# Patient Record
Sex: Female | Born: 1960 | Race: White | Hispanic: No | Marital: Single | Smoking: Never smoker
Health system: Southern US, Community
[De-identification: ages and names within clinical notes are randomized; demographics above are authoritative.]

## PROBLEM LIST (undated history)

## (undated) DIAGNOSIS — J45909 Unspecified asthma, uncomplicated: Secondary | ICD-10-CM

## (undated) DIAGNOSIS — T7840XA Allergy, unspecified, initial encounter: Secondary | ICD-10-CM

## (undated) DIAGNOSIS — E119 Type 2 diabetes mellitus without complications: Secondary | ICD-10-CM

## (undated) DIAGNOSIS — K219 Gastro-esophageal reflux disease without esophagitis: Secondary | ICD-10-CM

## (undated) DIAGNOSIS — R011 Cardiac murmur, unspecified: Secondary | ICD-10-CM

## (undated) HISTORY — DX: Gastro-esophageal reflux disease without esophagitis: K21.9

## (undated) HISTORY — DX: Allergy, unspecified, initial encounter: T78.40XA

## (undated) HISTORY — DX: Unspecified asthma, uncomplicated: J45.909

## (undated) HISTORY — PX: CHOLECYSTECTOMY: SHX55

## (undated) HISTORY — DX: Cardiac murmur, unspecified: R01.1

## (undated) HISTORY — DX: Type 2 diabetes mellitus without complications: E11.9

## (undated) HISTORY — PX: OTHER SURGICAL HISTORY: SHX169

---

## 1997-10-10 HISTORY — PX: NASAL SINUS SURGERY: SHX719

## 2006-02-27 ENCOUNTER — Ambulatory Visit: Payer: Self-pay | Admitting: Gastroenterology

## 2006-03-10 HISTORY — PX: NECK SURGERY: SHX720

## 2006-03-13 ENCOUNTER — Observation Stay (HOSPITAL_COMMUNITY): Admission: RE | Admit: 2006-03-13 | Discharge: 2006-03-14 | Payer: Self-pay | Admitting: Neurosurgery

## 2006-05-05 ENCOUNTER — Encounter: Payer: Self-pay | Admitting: Neurosurgery

## 2006-05-10 ENCOUNTER — Encounter: Payer: Self-pay | Admitting: Neurosurgery

## 2006-06-10 ENCOUNTER — Encounter: Payer: Self-pay | Admitting: Neurosurgery

## 2006-07-10 ENCOUNTER — Encounter: Payer: Self-pay | Admitting: Neurosurgery

## 2007-03-29 ENCOUNTER — Ambulatory Visit: Payer: Self-pay | Admitting: Family Medicine

## 2007-07-01 ENCOUNTER — Emergency Department: Payer: Self-pay | Admitting: Internal Medicine

## 2008-08-05 ENCOUNTER — Ambulatory Visit: Payer: Self-pay | Admitting: Gastroenterology

## 2008-08-08 ENCOUNTER — Encounter (HOSPITAL_COMMUNITY): Payer: Self-pay | Admitting: Obstetrics and Gynecology

## 2008-08-08 ENCOUNTER — Ambulatory Visit (HOSPITAL_COMMUNITY): Admission: RE | Admit: 2008-08-08 | Discharge: 2008-08-08 | Payer: Self-pay | Admitting: Obstetrics and Gynecology

## 2009-08-17 ENCOUNTER — Ambulatory Visit: Payer: Self-pay | Admitting: Unknown Physician Specialty

## 2009-08-17 ENCOUNTER — Other Ambulatory Visit: Payer: Self-pay | Admitting: Unknown Physician Specialty

## 2009-08-19 ENCOUNTER — Ambulatory Visit: Payer: Self-pay | Admitting: Unknown Physician Specialty

## 2010-04-07 ENCOUNTER — Ambulatory Visit: Payer: Self-pay | Admitting: Family Medicine

## 2010-05-03 ENCOUNTER — Ambulatory Visit: Payer: Self-pay | Admitting: Rheumatology

## 2010-05-20 ENCOUNTER — Encounter: Payer: Self-pay | Admitting: Rheumatology

## 2010-06-10 ENCOUNTER — Encounter: Payer: Self-pay | Admitting: Rheumatology

## 2011-02-22 NOTE — Op Note (Signed)
NAMELAYANNA, Darlene Rollins                ACCOUNT NO.:  [id number]   MEDICAL RECORD NO.:  [id number]          PATIENT TYPE:  AMB   LOCATION:                                FACILITY:  [facility]   PHYSICIAN:  Zelphia Cairo, MD    DATE OF BIRTH:  11-28-1960   DATE OF PROCEDURE:  08/08/2008  DATE OF DISCHARGE:                               OPERATIVE REPORT   PREOPERATIVE DIAGNOSES:  1. Menorrhagia.  2. Irregular menses.   POSTOPERATIVE DIAGNOSES:  1. Menorrhagia.  2. Irregular menses.   PROCEDURE:  Hysteroscopy, D&C, and NovaSure ablation.   SURGEON:  Zelphia Cairo, MD   SPECIMEN:  Endometrial curettings to Pathology.   FINDINGS:  Normal-appearing uterine cavity.   ESTIMATED BLOOD LOSS:  Minimal.   COMPLICATIONS:  None.   CONDITION:  Stable to recovery room.   DESCRIPTION OF PROCEDURE:  The patient was taken to the operating room  where anesthesia was found to be adequate.  She was placed in the dorsal  lithotomy position using Allen stirrups.  She was prepped and draped in  sterile fashion.  Weighted speculum was placed in the posterior vagina a  Deaver was placed anteriorly.  The cervix was grasped with a single-  tooth tenaculum.  The cervix was serially dilated using Pratt dilators.  Cervical length was found to be 3 cm.  Uterine cavity was found to be 7  cm.  The hysteroscope was inserted and a survey of the uterus was  performed.  Uterine lining appeared normal.  No masses were noted.  Hysteroscope was then removed and a gentle curetting was performed.  Specimen was placed on Telfa and passed off to be sent to Pathology.  NovaSure device was then inserted into the uterine cavity and NovaSure  ablation was performed using standard manufacture guidelines.  The  patient tolerated the procedure well.  Sponge lap, needle, and  instrument counts were correct x2.      Zelphia Cairo, MD  Electronically Signed    GA/MEDQ  D:  08/08/2008  T:  08/08/2008  Job:  [id number]

## 2011-02-25 NOTE — Op Note (Signed)
NAMECASANDRA, DALLAIRE NO.:  [id number]   MEDICAL RECORD NO.:  [id number]          PATIENT TYPE:  INP   LOCATION:  2899                         FACILITY:  [facility]   PHYSICIAN:  Clydene Fake, M.D.  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   POSTOPERATIVE DIAGNOSIS:  Herniated nucleus pulposus, spondylosis with  stenosis and cord compression myelopathy, C4-5 and C5-6.   PROCEDURE:  Anterior cervical diskectomy and fusion at C4-5 and C5-6 with  PEEK interbody cages; autograft bone, same incision; and EAGLE anterior  cervical plate; and allograft substitute 2 mL of bone protein.   SURGEON:  Clydene Fake, M.D.   ASSISTANT:  Cristi Loron, M.D.   ANESTHESIA:  General endotracheal tube anesthesia.   ESTIMATED BLOOD LOSS:  No blood given.   DRAINS:  None.   COMPLICATIONS:  None.   REASON FOR PROCEDURE:  The patient is a 50 year old woman who is having a  neck ache, left arm pain and numbness, turning a little slow directly from  the movements, some mild __________  problems and found on MRI to have multi-  level spondylitic changes, mostly that at C5-6, with a large central disk  protrusion and __________  ligament causing cord compression with similar  change, but not as bad, at C4-5 and more mild changes at C6-7 to C7-T1.  The  patient is brought in for decompression and fusion at C4-5 and C5-6 levels.   PROCEDURE:  The patient was brought into the operating room and general  anesthesia was induced.  The patient was placed in 10-pounds of __________  traction, prepped and draped in sterile fashion.  The surgical incision was  injected with 10 mL of 1% lidocaine with epinephrine.  An incision was then  made from the midline to the anterior border of  the sternocleidomastoid  muscle on the left side of the neck incision and to the platysma.  Hemostasis was obtained with Bovie cauterization. The platysma was incised  with the Bovie and blunt dissection was taken through the anterior cervical  fascia to the anterior cervical spine and exposed the cervical spine.  Placed a needle on the interspace.  X-rays were obtained showing we were at  the C4-5 interspace.  The interspace was then incised with a 15-blade and  prior to the diskectomy, it was removed as was the needle was removed.  The  longus collis was reflected laterally using the Bovie at C4 through C6  bilaterally and self-retaining retractors were placed.  Osteophytes were  removed from C5-6.  This disk space was incised.  Her diskectomy was  performed.  Distraction pins were placed at the C4 and C6 in the interspaces  were distracted.  The microscope was then brought in for microdissection.  At this point, a drill was used to continue the diskectomy and remove  cartilaginous endplate at both levels starting at  C5-6.  I continued with  the diskectomy with pituitary rongeurs.  With 1- and 2-mm Kerrison punches,  __________  disk starting out laterally and got to the dura.  Did a  foraminotomy on both sides and started working medially and removed disk  material, but then ran into a large calcified ligament that was somewhat  adherent to the dura.  We carefully dissected the ligament off of the dura  using hooks and removing it with Kerrison punches.  We were able to get this  removed.  There was a small dural rent that occurred or at least a partial  __________  material was still there.  No leakage of CSF even with Valsalva  was seen again.  No leakage of CSF ever occurred.  Again, we removed  osteophytes and calcified ligament, both superior and inferior endplates and  got good __________  of the central canal.  We again checked the nerve  roots, they are decompressed.  Teflon  __________ was placed in the  interspaces.   Our attention was then taken to what should be C4-5.  We begin diskectomy  with pituitary rongeurs and curets of 1-mm and 2-mm Kerrison punches are  used to remove posterior ligament, posterior disk, and perform bilateral  foraminotomies.  __________  we had good decompression of the central canal  and foramen.  Measured the interspaces to be 5 mm at C4-5 and 5 mm at 5-6.  The PEEK interbody cages were then packed with autograft bone.  This was  from the osteophytes removed during the case.  They were chopped up into  small pieces, packed in the cage, and some demineralized bone protein was  then placed in and around the autograft bone.  The cages were then tapped  into place, countersunk about 1 mm.  We checked post-radiograph and there is  plenty of room between bone graft and dura.  The distraction pins were  removed.  Hemostasis was obtained with Gelfoam and thrombin __________  antibiotic solution with good position of the cages. They are permanently  placed.  Traction was removed.  They remained in place.  An EAGLE anterior  cervical plate was placed over the anterior cervical spine.  Two screws were  placed into C4, two into C5, two into C6.  These were tightened down and  lateral x-rays were obtained.  It showed good position of plate and screws  at C4-5, but we could not see C5-6 level because of the patient's shoulders  and body habitus.  Traction was removed.  Hemostasis was obtained with Bovie  cauterization and bipolar cauterization with __________  solution, we had  good hemostasis.  The platysma was closed with 3-0 Vicryl interrupted  suture.  The subcutaneous tissue was closed with the same.  The skin was  closed with benzoin and Steri-Strips.  A dressing was applied.  The patient  was placed back into a soft cervical collar, awoke from anesthesia and  transferred to the recovery room in stable condition.           ______________________________  Clydene Fake, M.D.     JRH/MEDQ  D:  03/13/2006  T:  03/14/2006  Job:  [id number]

## 2011-07-08 ENCOUNTER — Ambulatory Visit: Payer: Self-pay | Admitting: Gastroenterology

## 2011-07-12 LAB — ABO/RH: ABO/RH(D): O POS

## 2011-07-12 LAB — PREGNANCY, URINE: Preg Test, Ur: NEGATIVE

## 2011-07-12 LAB — BASIC METABOLIC PANEL
BUN: 9
CO2: 30
Creatinine, Ser: 0.85
GFR calc non Af Amer: >60
Potassium: 4.1
Sodium: 139

## 2011-07-12 LAB — CBC
Hemoglobin: 12.5
MCHC: 32.8
MCV: 85
RBC: 4.49
RDW: 14.5

## 2011-07-12 LAB — GLUCOSE, CAPILLARY: Glucose-Capillary: 152 — ABNORMAL HIGH

## 2011-07-12 LAB — TYPE AND SCREEN: ABO/RH(D): O POS

## 2012-04-04 ENCOUNTER — Ambulatory Visit: Payer: Self-pay | Admitting: Dermatology

## 2012-04-09 ENCOUNTER — Ambulatory Visit: Payer: Self-pay | Admitting: Family Medicine

## 2012-08-08 ENCOUNTER — Ambulatory Visit: Payer: Self-pay | Admitting: Gastroenterology

## 2012-08-21 ENCOUNTER — Ambulatory Visit: Payer: Self-pay | Admitting: Gastroenterology

## 2012-08-30 ENCOUNTER — Ambulatory Visit: Payer: Self-pay | Admitting: Gastroenterology

## 2012-10-17 ENCOUNTER — Ambulatory Visit: Payer: Self-pay | Admitting: Surgery

## 2012-11-01 ENCOUNTER — Ambulatory Visit: Payer: Self-pay | Admitting: Surgery

## 2012-11-02 LAB — PATHOLOGY REPORT

## 2013-04-22 ENCOUNTER — Ambulatory Visit: Payer: Self-pay | Admitting: Family Medicine

## 2013-09-13 ENCOUNTER — Ambulatory Visit: Payer: Self-pay | Admitting: Gastroenterology

## 2013-09-18 LAB — PATHOLOGY REPORT

## 2013-10-30 ENCOUNTER — Ambulatory Visit: Payer: Self-pay | Admitting: Gastroenterology

## 2014-05-14 ENCOUNTER — Ambulatory Visit: Payer: Self-pay | Admitting: Family Medicine

## 2014-07-18 ENCOUNTER — Ambulatory Visit: Payer: Self-pay | Admitting: Gastroenterology

## 2014-07-21 LAB — PATHOLOGY REPORT

## 2014-10-10 HISTORY — PX: NECK SURGERY: SHX720

## 2014-10-27 ENCOUNTER — Ambulatory Visit: Payer: Self-pay | Admitting: Surgery

## 2015-01-30 NOTE — Op Note (Signed)
PATIENT NAME:  Darlene Rollins, Darlene Rollins MR#:  [id number] DATE OF BIRTH:  [birth year]  DATE OF PROCEDURE:  11/01/2012  PREOPERATIVE DIAGNOSIS: Chronic acalculous cholecystitis.   POSTOPERATIVE DIAGNOSIS: Chronic acalculous cholecystitis.  PROCEDURE: Laparoscopic cholecystectomy.  SURGEON: Renda RollsWilton Cherish Runde, MD  ANESTHESIA: General.   INDICATION: This 54 year old female has a five year history of epigastric pains. She had a hepatobiliary scan with injection of cholecystokinin. The ejection fraction was 55%. However, the injection exactly reproduced her pain and surgery was recommended for definitive treatment.   DESCRIPTION OF PROCEDURE: The patient was placed on the operating table, in the supine position, under general endotracheal anesthesia. The abdomen was prepared with ChloraPrep and draped in a sterile manner.   A short incision was made in the inferior aspect of the umbilicus and carried down to the deep fascia which was grasped with laryngeal hook and elevated. A Veress needle was inserted, aspirated and irrigated with a saline solution. Next, the peritoneal cavity was inflated with carbon dioxide. The Veress needle was removed. The 10 mm cannula was inserted. The 10 mm, 0 degree laparoscope was then introduced to view the peritoneal cavity. The liver appeared to have a smooth surface and typical color. Brief survey revealed no specific abnormality with the intestines that came into view. Another incision was made in the epigastrium slightly to the right of the midline to introduce an 11 mm cannula. Two 5 mm cannulas were inserted through short incisions, in the right upper quadrant. The gallbladder was found to have a fatty surface and was grasped and retracted towards the right shoulder. The infundibulum was retracted inferiorly and laterally. Dissection was carried down through a layer of fat with blunt and sharp dissection and isolated the cystic duct from surrounding structures. The cystic artery was  dissected free from surrounding structures. The gallbladder neck was mobilized with incision of the visceral peritoneum. A critical view of safety was demonstrated. An Endo Clip was placed across the cystic duct adjacent to the neck of the gallbladder. An incision was made in the cystic duct to introduce a Reddick catheter. The Reddick catheter would only thread in as far as the balloon, however, it would not irrigate and the balloon would not stay in intact and, therefore, cholangiogram was not done. The Reddick catheter was removed. The cystic duct was doubly ligated with endoclips and divided. The cystic artery was controlled with double endoclips and divided. The gallbladder was dissected free from the liver with hook and cautery. A small amount of blood was aspirated. Several small bleeding points were cauterized. Hemostasis was subsequently intact. The gallbladder was completely separated and was brought up through the infraumbilical incision, opened and suctioned and removed and submitted in formalin for routine pathology. The right upper quadrant was further inspected. The cannulas were removed. Carbon dioxide was allowed to escape from the peritoneal cavity. Skin incisions were closed with interrupted 5-0 chromic subcuticular sutures, benzoin and Steri-Strips. Dressings were applied with paper tape. The patient tolerated surgery satisfactorily and was then moved to the recovery room for postoperative care. ____________________________ J. Renda RollsWilton Syncere Kaminski, MD jws:sb Rollins: 11/01/2012 13:07:56 ET T: 11/01/2012 13:31:47 ET JOB#: [id number]  cc: Adella HareJ. Wilton Hensley Treat, MD, <Dictator> Adella HareWILTON J Ianna Salmela MD ELECTRONICALLY SIGNED 11/03/2012 12:40

## 2015-07-30 ENCOUNTER — Other Ambulatory Visit: Payer: Self-pay | Admitting: Family Medicine

## 2015-07-30 DIAGNOSIS — Z1231 Encounter for screening mammogram for malignant neoplasm of breast: Secondary | ICD-10-CM

## 2015-08-13 ENCOUNTER — Ambulatory Visit: Payer: Self-pay | Attending: Family Medicine

## 2015-09-01 ENCOUNTER — Ambulatory Visit
Admission: RE | Admit: 2015-09-01 | Discharge: 2015-09-01 | Disposition: A | Discharge status: Home / Self Care | Payer: BLUE CROSS/BLUE SHIELD | Source: Ambulatory Visit | Attending: Family Medicine | Admitting: Family Medicine

## 2015-09-01 DIAGNOSIS — Z1231 Encounter for screening mammogram for malignant neoplasm of breast: Secondary | ICD-10-CM | POA: Insufficient documentation

## 2016-12-02 ENCOUNTER — Other Ambulatory Visit: Payer: Self-pay | Admitting: Family Medicine

## 2016-12-02 DIAGNOSIS — Z1231 Encounter for screening mammogram for malignant neoplasm of breast: Secondary | ICD-10-CM

## 2016-12-30 ENCOUNTER — Ambulatory Visit: Payer: BLUE CROSS/BLUE SHIELD

## 2017-01-03 ENCOUNTER — Ambulatory Visit
Admission: RE | Admit: 2017-01-03 | Discharge: 2017-01-03 | Disposition: A | Discharge status: Home / Self Care | Payer: BLUE CROSS/BLUE SHIELD | Source: Ambulatory Visit | Attending: Family Medicine | Admitting: Family Medicine

## 2017-01-03 DIAGNOSIS — Z1231 Encounter for screening mammogram for malignant neoplasm of breast: Secondary | ICD-10-CM | POA: Diagnosis not present

## 2017-01-31 ENCOUNTER — Encounter: Payer: Self-pay | Admitting: Speech Pathology

## 2017-01-31 ENCOUNTER — Ambulatory Visit: Payer: BLUE CROSS/BLUE SHIELD | Attending: Unknown Physician Specialty | Admitting: Speech Pathology

## 2017-01-31 DIAGNOSIS — R490 Dysphonia: Secondary | ICD-10-CM | POA: Insufficient documentation

## 2017-01-31 NOTE — Therapy (Signed)
[place] [facility] MAIN [facility] SERVICES [address] [place], [place], [address] Phone: [phone]   Fax:  [phone]  Speech Language Pathology Evaluation  Patient Details  Name: Darlene Rollins MRN: [id number] Date of Birth: [birth year] Referring Provider: Dr. Jenne Campus  Encounter Date: 01/31/2017      End of Session - 01/31/17 1445    Visit Number 1   Number of Visits 17   Date for SLP Re-Evaluation 04/02/17   SLP Start Time 0906   SLP Stop Time  0956   SLP Time Calculation (min) 50 min   Activity Tolerance Patient tolerated treatment well      Past Medical History:  Diagnosis Date  . Allergy   . Asthma   . Diabetes mellitus without complication (HCC)   . GERD (gastroesophageal reflux disease)     Past Surgical History:  Procedure Laterality Date  . cervical surgeries      There were no vitals filed for this visit.          SLP Evaluation [facility] - 01/31/17 0001      SLP Visit Information   SLP Received On 01/31/17   Referring Provider Dr. Jenne Campus   Onset Date 01/24/2017   Medical Diagnosis Dysphonia     Subjective   Subjective Complaints 2 years ago: Patient not really concerned about her voice, but her surgeon noted some hoarseness and wanted this checked prior to revision ACDF. Patient reports some hoarseness going back 10+ years. This has worsened somewhat, such that she stopped singing in church choir 2 years ago. She has allergies, asthma, GER. She has to sleep upright to avoid reflux. Has some postnasal drainage and coughing, usually dry. She says her voice has always been deep   Patient/Family Stated Goal Talk with a real voice     General Information   HPI 56 year old woman referred by Dr. Jenne Campus for voice therapy.  Per report, laryngeal findings include: erythema resolved, small ulcer on false cord gone, and still using false cords to talk. The patient reports that she was evaluated at [facility] by ENT and SLP/voice therapist  in 2016.  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      Prior Functional Status   Cognitive/Linguistic Baseline Within functional limits     Oral Motor/Sensory Function   Overall Oral Motor/Sensory Function Appears within functional limits for tasks assessed     Motor Speech   Overall Motor Speech Impaired   Respiration Impaired   Level of Impairment Conversation   Phonation Aphonic   Resonance --  Pharyngeal focus when voicing   Articulation Within functional limitis   Intelligibility Intelligible   Phonation Impaired   Vocal Abuses Habitual Hyperphonia   Tension Present Jaw;Neck;Shoulder   Volume Soft  aphonic   Pitch Aphonic     Standardized Assessments   Standardized Assessments  Other Assessment  Perceptual Voice Evaluation      Perceptual Voice Evaluation Voice checklist: . Health risks: asthma, GERD, allergies, multiple neck surgeries . Characteristic voice use: laryngitis X4 months  . Environmental risks: no significant environmental risks . Misuse/Abuse: coughing/throat clearing; excessive muscle tension; whispering Vocal characteristics: aphonic; (2 years ago: Poor breath-phonation coordination; hoarse vocal quality; and pharyngeal tone-focus) Maximum phonation time for sustained "ah": Cannot phonate Average fundamental frequency during sustained "ah": N/A Highest dynamic pitch when altering pitch from a low note to a high  note: N/A Lowest dynamic pitch when altering from a high note to a low note: N/A Highest dynamic pitch in conversational speech: N/A Lowest dynamic pitch in conversational speech: N/A Average time patient was able to sustain /s/: 13 seconds Average time patient was able to sustain /z/: N/A s/z ratio : N/A Education: Patient instructed in extrinsic laryngeal muscle stretches; breath support exercises; lip/tongue trill; straw  phonation Stimulability: Patient achieved phonation (hoarse and strained) with trills and straw phonation      SLP Education - 01/31/17 1445    Education provided Yes   Education Details Role of SLP in voice therapy   Person(s) Educated Patient   Methods Explanation   Comprehension Verbalized understanding            SLP Long Term Goals - 01/31/17 1447      SLP LONG TERM GOAL #1   Title The patient will demonstrate independent understanding of vocal hygiene concepts and extrinsic laryngeal muscle stretches.     Time 8   Period Weeks   Status New     SLP LONG TERM GOAL #2   Title The patient will be independent for abdominal breathing and breath support exercises.   Time 8   Period Weeks   Status New     SLP LONG TERM GOAL #3   Title The patient will maximize voice quality and loudness using breath support/oral resonance for sustained vowel production, pitch glides, and hierarchal speech drill.   Time 8   Period Weeks   Status New     SLP LONG TERM GOAL #4   Title The patient will maximize voice quality and loudness using breath support/oral resonance for paragraph length recitation with 80% accuracy.   Time 8   Period Weeks   Status New          Plan - 01/31/17 1446    Clinical Impression Statement 56 year old woman; under the care of Dr. 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 The patient reports long term history of voice disturbance, but she has never experienced ongoing laryngitis.  The patient was stimulable to phonation (hoarse and strained) with trills and straw phonation.  She will benefit from voice therapy for education, to improve breath support/control, reduce laryngeal tension, and improve tone focus.   Speech Therapy Frequency 2x / week   Duration Other (comment)  8 weeks   Potential to Achieve  Goals Good   Potential Considerations Ability to learn/carryover information;Co-morbidities;Cooperation/participation level;Medical prognosis;Previous level of function;Severity of impairments;Family/community support   SLP Home Exercise Plan extrinsic laryngeal muscle stretches; breath support exercises; lip/tongue trill; straw phonation   Consulted and Agree with Plan of Care Patient      Patient will benefit from skilled therapeutic intervention in order to improve the following deficits and impairments:   Dysphonia - Plan: SLP plan of care cert/re-cert    Problem List There are no active problems to display for this patient.  [name], MS/CCC- SLP  Leandrew Koyanagi 01/31/2017, 2:51 PM  [place] [facility] MAIN [facility] SERVICES [address] [place], [place], [address] Phone: [phone]   Fax:  [phone]  Name: DESTINY HAGIN MRN: [id number] Date of Birth: 1961-08-05

## 2017-02-07 ENCOUNTER — Ambulatory Visit: Payer: BLUE CROSS/BLUE SHIELD | Attending: Unknown Physician Specialty | Admitting: Speech Pathology

## 2017-02-07 DIAGNOSIS — R490 Dysphonia: Secondary | ICD-10-CM | POA: Diagnosis present

## 2017-02-07 NOTE — Therapy (Signed)
[place] [facility] MAIN [facility] SERVICES [address] [place], [place], [address] Phone: [phone]   Fax:  [phone]  Speech Language Pathology Treatment  Patient Details  Name: Darlene Rollins MRN: [id number] Date of Birth: Oct 31, 1960 Referring Provider: Dr. Jenne Campus  Encounter Date: 02/07/2017      End of Session - 02/07/17 1115    Visit Number 2   Number of Visits 17   Date for SLP Re-Evaluation 04/02/17   SLP Start Time 0900   SLP Stop Time  0950   SLP Time Calculation (min) 50 min   Activity Tolerance Patient tolerated treatment well      Past Medical History:  Diagnosis Date  . Allergy   . Asthma   . Diabetes mellitus without complication (HCC)   . GERD (gastroesophageal reflux disease)     Past Surgical History:  Procedure Laterality Date  . cervical surgeries      There were no vitals filed for this visit.             ADULT SLP TREATMENT - 02/07/17 0001      General Information   Behavior/Cognition Alert;Cooperative;Pleasant mood   HPI 56 year old woman referred by Dr. Jenne Campus for voice therapy.  Per report, laryngeal findings include: erythema resolved, small ulcer on false cord gone, and still using false cords to talk. The patient reports that she was evaluated at [facility] by ENT and SLP/voice therapist in 2016.  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      Treatment Provided   Treatment provided Cognitive-Linquistic     Pain Assessment   Pain Assessment No/denies pain     Cognitive-Linquistic Treatment   Treatment focused on Voice   Skilled Treatment The patient was provided with written and verbal teaching regarding neck, shoulder, tongue, and throat stretches exercises to promote relaxed phonation. The patient was provided with written and verbal teaching for supplement vocal tract relaxation exercises (straw  phonation).  The patient was provided with written and verbal teaching regarding breath support exercises.  Patient instructed in relaxed phonation / oral resonance. Improved oral resonance/relaxed phonation for hum/hum + vowel/ initial /m/ words.       Assessment / Recommendations / Plan   Plan Continue with current plan of care     Progression Toward Goals   Progression toward goals Progressing toward goals          SLP Education - 02/07/17 1114    Education provided Yes   Education Details Breath support, relaxed phonation, tone focus   Person(s) Educated Patient   Methods Explanation   Comprehension Verbalized understanding            SLP Long Term Goals - 01/31/17 1447      SLP LONG TERM GOAL #1   Title The patient will demonstrate independent understanding of vocal hygiene concepts and extrinsic laryngeal muscle stretches.     Time 8   Period Weeks   Status New     SLP LONG TERM GOAL #2   Title The patient will be independent for abdominal breathing and breath support exercises.   Time 8   Period Weeks   Status New     SLP LONG TERM GOAL #3   Title The patient will maximize voice quality and loudness using breath support/oral resonance for sustained vowel production, pitch glides, and hierarchal speech drill.  Time 8   Period Weeks   Status New     SLP LONG TERM GOAL #4   Title The patient will maximize voice quality and loudness using breath support/oral resonance for paragraph length recitation with 80% accuracy.   Time 8   Period Weeks   Status New          Plan - 02/07/17 1115    Clinical Impression Statement The patient is generating voice, albeit moderately hoarse and strained.  She is able to improve quality and tone focus with nasality and improved strength of breath support.   Speech Therapy Frequency 2x / week   Duration Other (comment)   Treatment/Interventions Patient/family education;Other (comment)  Voice therapy   Potential to Achieve  Goals Good   Potential Considerations Ability to learn/carryover information;Co-morbidities;Cooperation/participation level;Medical prognosis;Previous level of function;Severity of impairments;Family/community support   SLP Home Exercise Plan extrinsic laryngeal muscle stretches; breath support exercises; lip/tongue trill; straw phonation; oral resonance   Consulted and Agree with Plan of Care Patient      Patient will benefit from skilled therapeutic intervention in order to improve the following deficits and impairments:   Dysphonia    Problem List There are no active problems to display for this patient.  [name], MS/CCC- SLP  Leandrew Koyanagi 02/07/2017, 11:16 AM  [place] [facility] MAIN [facility] SERVICES [address] [place], [place], [address] Phone: [phone]   Fax:  [phone]   Name: Darlene Rollins MRN: [id number] Date of Birth: 20-May-1961

## 2017-02-10 ENCOUNTER — Encounter: Payer: Self-pay | Admitting: Speech Pathology

## 2017-02-10 ENCOUNTER — Ambulatory Visit: Payer: BLUE CROSS/BLUE SHIELD | Admitting: Speech Pathology

## 2017-02-10 DIAGNOSIS — R490 Dysphonia: Secondary | ICD-10-CM

## 2017-02-10 NOTE — Therapy (Signed)
[facility] [address] [place], [place], [address] Phone: [phone]   Fax:  [phone]  Speech Language Pathology Treatment  Patient Details  Name: Darlene Rollins MRN: [id number] Date of Birth: 07/02/1961 Referring Provider: Dr. Jenne CampusMcQueen  Encounter Date: 02/10/2017      End of Session - 02/10/17 0958    Visit Number 3   Number of Visits 17   Date for SLP Re-Evaluation 04/02/17   SLP Start Time 0900   SLP Stop Time  0948   SLP Time Calculation (min) 48 min   Activity Tolerance Patient tolerated treatment well      Past Medical History:  Diagnosis Date  . Allergy   . Asthma   . Diabetes mellitus without complication (HCC)   . GERD (gastroesophageal reflux disease)     Past Surgical History:  Procedure Laterality Date  . cervical surgeries      There were no vitals filed for this visit.      Subjective Assessment - 02/10/17 0957    Subjective Patient feels that she is not using her false cords   Currently in Pain? No/denies               ADULT SLP TREATMENT - 02/10/17 0001      General Information   Behavior/Cognition Alert;Cooperative;Pleasant mood   HPI 56 year old woman referred by Dr. Jenne CampusMcQueen for voice therapy.  Per report, laryngeal findings include: erythema resolved, small ulcer on false cord gone, and still using false cords to talk. The patient [facility]voice therapist in 2016.  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      Treatment Provided   Treatment provided Cognitive-Linquistic     Pain Assessment   Pain Assessment No/denies pain     Cognitive-Linquistic Treatment   Treatment focused on Voice   Skilled Treatment The patient was provided with written and verbal teaching regarding neck, shoulder, tongue, and throat stretches exercises  to promote relaxed phonation. The patient was provided with written and verbal teaching for supplement vocal tract relaxation exercises (straw phonation).  The patient was provided with written and verbal teaching regarding breath support exercises.  Patient instructed in relaxed phonation / oral resonance. Improved oral resonance/relaxed phonation for "whooo".  Patient not able to generate oral resonance for other vowels within this context.  She understands the target and will practice.     Assessment / Recommendations / Plan   Plan Continue with current plan of care     Progression Toward Goals   Progression toward goals Progressing toward goals          SLP Education - 02/10/17 0958    Education provided Yes   Education Details Semi-occluded vocal tract   Person(s) Educated Patient   Methods Explanation   Comprehension Verbalized understanding            SLP Long Term Goals - 01/31/17 1447      SLP LONG TERM GOAL #1   Title The patient will demonstrate independent understanding of vocal hygiene concepts and extrinsic laryngeal muscle stretches.     Time 8   Period Weeks   Status New     SLP LONG TERM GOAL #2   Title The patient will be independent for abdominal breathing and breath support exercises.   Time 8  Period Weeks   Status New     SLP LONG TERM GOAL #3   Title The patient will maximize voice quality and loudness using breath support/oral resonance for sustained vowel production, pitch glides, and hierarchal speech drill.   Time 8   Period Weeks   Status New     SLP LONG TERM GOAL #4   Title The patient will maximize voice quality and loudness using breath support/oral resonance for paragraph length recitation with 80% accuracy.   Time 8   Period Weeks   Status New          Plan - 02/10/17 4098    Clinical Impression Statement The patient is generating voice, albeit moderately hoarse and strained.  She is able to improve quality and tone focus with  semi-occluded vocal tract and improved strength of breath support.   Speech Therapy Frequency 2x / week   Duration Other (comment)   Treatment/Interventions Patient/family education;Other (comment)  Voice therapy   Potential to Achieve Goals Good   Potential Considerations Ability to learn/carryover information;Co-morbidities;Cooperation/participation level;Medical prognosis;Previous level of function;Severity of impairments;Family/community support   SLP Home Exercise Plan extrinsic laryngeal muscle stretches; breath support exercises; lip/tongue trill; straw phonation; oral resonance   Consulted and Agree with Plan of Care Patient      Patient will benefit from skilled therapeutic intervention in order to improve the following deficits and impairments:   Dysphonia    Problem List There are no active problems to display for this patient.  [name], MS/CCC- SLP  Leandrew Koyanagi 02/10/2017, 9:59 AM  [place] [facility] MAIN [facility] SERVICES [address] [place], [place], [address] Phone: [phone]   Fax:  [phone]   Name: Darlene Rollins MRN: [id number] Date of Birth: 04-Jul-1961

## 2017-02-14 ENCOUNTER — Ambulatory Visit: Payer: BLUE CROSS/BLUE SHIELD | Admitting: Speech Pathology

## 2017-02-14 DIAGNOSIS — R490 Dysphonia: Secondary | ICD-10-CM

## 2017-02-15 ENCOUNTER — Encounter: Payer: Self-pay | Admitting: Speech Pathology

## 2017-02-15 NOTE — Therapy (Signed)
[facility] [address] [place], [place], [address] Phone: [phone]   Fax:  [phone]  Speech Language Pathology Treatment  Patient Details  Name: Darlene Rollins MRN: [id number] Date of Birth: 09/16/1961 Referring Provider: Dr. Jenne CampusMcQueen  Encounter Date: 02/14/2017      End of Session - 02/15/17 1027    Visit Number 4   Number of Visits 17   Date for SLP Re-Evaluation 04/02/17   SLP Start Time 0900   SLP Stop Time  0947   SLP Time Calculation (min) 47 min   Activity Tolerance Patient tolerated treatment well      Past Medical History:  Diagnosis Date  . Allergy   . Asthma   . Diabetes mellitus without complication (HCC)   . GERD (gastroesophageal reflux disease)     Past Surgical History:  Procedure Laterality Date  . cervical surgeries      There were no vitals filed for this visit.      Subjective Assessment - 02/15/17 1026    Subjective Patient feels that she is not using her false cords               ADULT SLP TREATMENT - 02/15/17 0001      General Information   Behavior/Cognition Alert;Cooperative;Pleasant mood   HPI 56 year old woman referred by Dr. Jenne CampusMcQueen for voice therapy.  Per report, laryngeal findings include: erythema resolved, small ulcer on false cord gone, and still using false cords to talk. The patient reports that [facility]voice therapist in 2016.  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      Treatment Provided   Treatment provided Cognitive-Linquistic     Pain Assessment   Pain Assessment No/denies pain     Cognitive-Linquistic Treatment   Treatment focused on Voice   Skilled Treatment The patient was provided with written and verbal teaching regarding neck, shoulder, tongue, and throat stretches exercises to promote relaxed phonation.  The patient was provided with written and verbal teaching for supplement vocal tract relaxation exercises (straw phonation).  The patient was provided with written and verbal teaching regarding breath support exercises.  Patient instructed in relaxed phonation / oral resonance. Improved oral resonance/relaxed phonation for "whooo" with inconsistent oral resonance for other vowels within this context.  She understands the target and will practice.     Assessment / Recommendations / Plan   Plan Continue with current plan of care     Progression Toward Goals   Progression toward goals Progressing toward goals          SLP Education - 02/15/17 1026    Education provided Yes   Education Details Semi-occluded vocal tract, flow phonation   Person(s) Educated Patient   Methods Explanation   Comprehension Verbalized understanding            SLP Long Term Goals - 01/31/17 1447      SLP LONG TERM GOAL #1   Title The patient will demonstrate independent understanding of vocal hygiene concepts and extrinsic laryngeal muscle stretches.     Time 8   Period Weeks   Status New     SLP LONG TERM GOAL #2   Title The patient will be independent for abdominal breathing and breath support exercises.   Time 8   Period Weeks   Status New  SLP LONG TERM GOAL #3   Title The patient will maximize voice quality and loudness using breath support/oral resonance for sustained vowel production, pitch glides, and hierarchal speech drill.   Time 8   Period Weeks   Status New     SLP LONG TERM GOAL #4   Title The patient will maximize voice quality and loudness using breath support/oral resonance for paragraph length recitation with 80% accuracy.   Time 8   Period Weeks   Status New          Plan - 02/15/17 1027    Clinical Impression Statement The patient is generating voice, albeit moderately hoarse and strained.  She is able to improve quality and tone focus with semi-occluded vocal tract  and improved strength of breath support.  She is consistently generating good vocal quality with "whoo" with inconsistent generalization to other vowels.   Speech Therapy Frequency 2x / week   Duration Other (comment)   Treatment/Interventions Patient/family education;Other (comment)  Voice therapy   Potential to Achieve Goals Good   Potential Considerations Ability to learn/carryover information;Co-morbidities;Cooperation/participation level;Medical prognosis;Previous level of function;Severity of impairments;Family/community support   SLP Home Exercise Plan extrinsic laryngeal muscle stretches; breath support exercises; lip/tongue trill; straw phonation; oral resonance   Consulted and Agree with Plan of Care Patient      Patient will benefit from skilled therapeutic intervention in order to improve the following deficits and impairments:   Dysphonia    Problem List There are no active problems to display for this patient.  [name], MS/CCC- SLP  Leandrew Koyanagi 02/15/2017, 10:33 AM  [place] [facility] MAIN [facility] SERVICES [address] [place], [place], [address] Phone: [phone]   Fax:  [phone]   Name: Darlene Rollins MRN: [id number] Date of Birth: Mar 26, 1961

## 2017-02-17 ENCOUNTER — Encounter: Payer: Self-pay | Admitting: Speech Pathology

## 2017-02-17 ENCOUNTER — Ambulatory Visit: Payer: BLUE CROSS/BLUE SHIELD | Admitting: Speech Pathology

## 2017-02-17 DIAGNOSIS — R490 Dysphonia: Secondary | ICD-10-CM

## 2017-02-17 NOTE — Therapy (Signed)
[facility] MAIN [facility] SERVICES [address] [place], [place], [address] Phone: [phone]   Fax:  [phone]  Speech Language Pathology Treatment  Patient Details  Name: Darlene Rollins MRN: [id number] Date of Birth: 1961-06-21 Referring Provider: Dr. Jenne Campus  Encounter Date: 02/17/2017      End of Session - 02/17/17 0956    Visit Number 5   Number of Visits 17   Date for SLP Re-Evaluation 04/02/17   SLP Start Time 0858   SLP Stop Time  0950   SLP Time Calculation (min) 52 min   Activity Tolerance Patient tolerated treatment well      Past Medical History:  Diagnosis Date  . Allergy   . Asthma   . Diabetes mellitus without complication (HCC)   . GERD (gastroesophageal reflux disease)     Past Surgical History:  Procedure Laterality Date  . cervical surgeries      There were no vitals filed for this visit.      Subjective Assessment - 02/17/17 0955    Subjective Patient states she was able to "clear out some of the crud" and has better voice as a consequence    Currently in Pain? No/denies               ADULT SLP TREATMENT - 02/17/17 0001      General Information   Behavior/Cognition Alert;Cooperative;Pleasant mood   HPI 56 year old woman referred by Dr. Jenne Campus for voice therapy.  Per report, laryngeal findings include: erythema resolved, small ulcer on false cord gone, and still using false cords to talk. The patient reports that she was evaluated at [facility] by ENT and SLP/voice therapist in 2016.  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      Treatment Provided   Treatment provided Cognitive-Linquistic     Pain Assessment   Pain Assessment No/denies pain     Cognitive-Linquistic Treatment   Treatment focused on Voice   Skilled Treatment The patient was provided with written and verbal teaching regarding neck,  shoulder, tongue, and throat stretches exercises to promote relaxed phonation. The patient was provided with written and verbal teaching for supplement vocal tract relaxation exercises (straw phonation).  The patient was provided with written and verbal teaching regarding breath support exercises.  Patient instructed in relaxed phonation / oral resonance. Improved oral resonance/relaxed phonation for "whooo" with inconsistent oral resonance for other vowels within this context.  Patient was able to identify that she "opens the throat wide" to improve vocal quality.     Assessment / Recommendations / Plan   Plan Continue with current plan of care     Progression Toward Goals   Progression toward goals Progressing toward goals          SLP Education - 02/17/17 0956    Education provided Yes   Education Details Oral resonance, breaking pharyngeal resonance   Person(s) Educated Patient   Methods Explanation   Comprehension Verbalized understanding            SLP Long Term Goals - 01/31/17 1447      SLP LONG TERM GOAL #1   Title The patient will demonstrate independent understanding of vocal hygiene concepts and extrinsic laryngeal muscle stretches.     Time 8   Period Weeks   Status New     SLP LONG TERM GOAL #2   Title  The patient will be independent for abdominal breathing and breath support exercises.   Time 8   Period Weeks   Status New     SLP LONG TERM GOAL #3   Title The patient will maximize voice quality and loudness using breath support/oral resonance for sustained vowel production, pitch glides, and hierarchal speech drill.   Time 8   Period Weeks   Status New     SLP LONG TERM GOAL #4   Title The patient will maximize voice quality and loudness using breath support/oral resonance for paragraph length recitation with 80% accuracy.   Time 8   Period Weeks   Status New          Plan - 02/17/17 0957    Clinical Impression Statement The patient is generating  voice, albeit moderately hoarse and strained.  She is able to improve quality and tone focus with semi-occluded vocal tract and improved strength of breath support.  She is consistently generating good vocal quality with "whoo" with inconsistent generalization to other vowels. Today she was able to identify how she improves the vocal quality.   Speech Therapy Frequency 2x / week   Duration Other (comment)   Treatment/Interventions Patient/family education;Other (comment)  Voice therapy   Potential to Achieve Goals Good   Potential Considerations Ability to learn/carryover information;Co-morbidities;Cooperation/participation level;Medical prognosis;Previous level of function;Severity of impairments;Family/community support   SLP Home Exercise Plan extrinsic laryngeal muscle stretches; breath support exercises; lip/tongue trill; straw phonation; oral resonance   Consulted and Agree with Plan of Care Patient      Patient will benefit from skilled therapeutic intervention in order to improve the following deficits and impairments:   Dysphonia    Problem List There are no active problems to display for this patient.  Dollene PrimroseSusan G Francetta Ilg, MS/CCC- SLP  Leandrew KoyanagiAbernathy, Susie 02/17/2017, 9:58 AM  [facility] [address] [place], [place], [address] Phone: [phone]   Fax:  [phone]   Name: Darlene Rollins MRN: [id number] Date of Birth: 12/27/1960

## 2017-02-21 ENCOUNTER — Encounter: Payer: Self-pay | Admitting: Speech Pathology

## 2017-02-21 ENCOUNTER — Ambulatory Visit: Payer: BLUE CROSS/BLUE SHIELD | Admitting: Speech Pathology

## 2017-02-21 DIAGNOSIS — R490 Dysphonia: Secondary | ICD-10-CM

## 2017-02-21 NOTE — Therapy (Signed)
[facility] [address] [place], [place], [address] Phone: [phone]   Fax:  [phone]  Speech Language Pathology Treatment  Patient Details  Name: Darlene Rollins MRN: [id number] Date of Birth: 12/24/1960 Referring Provider: Dr. Jenne CampusMcQueen  Encounter Date: 02/21/2017      End of Session - 02/21/17 0953    Visit Number 6   Number of Visits 17   Date for SLP Re-Evaluation 04/02/17   SLP Start Time 0900   SLP Stop Time  0943   SLP Time Calculation (min) 43 min   Activity Tolerance Patient tolerated treatment well      Past Medical History:  Diagnosis Date  . Allergy   . Asthma   . Diabetes mellitus without complication (HCC)   . GERD (gastroesophageal reflux disease)     Past Surgical History:  Procedure Laterality Date  . cervical surgeries      There were no vitals filed for this visit.      Subjective Assessment - 02/21/17 0953    Subjective Patient states she was able to "clear out some of the crud" and has better voice as a consequence    Currently in Pain? No/denies               ADULT SLP TREATMENT - 02/21/17 0001      General Information   Behavior/Cognition Alert;Cooperative;Pleasant mood   HPI 56 year old woman referred by Dr. Jenne CampusMcQueen for voice therapy.  Per report, laryngeal findings include: erythema resolved, small ulcer on false cord gone, and still using false cords to talk. The patient reports [facility] and [facility]/voice therapist in 2016.  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      Treatment Provided   Treatment provided Cognitive-Linquistic     Pain Assessment   Pain Assessment No/denies pain     Cognitive-Linquistic Treatment   Treatment focused on Voice   Skilled Treatment The patient was provided with written and verbal teaching regarding neck,  shoulder, tongue, and throat stretches exercises to promote relaxed phonation. The patient was provided with written and verbal teaching for supplement vocal tract relaxation exercises (straw phonation).  The patient was provided with written and verbal teaching regarding breath support exercises.  Patient instructed in relaxed phonation / oral resonance. Improved oral resonance/relaxed phonation for "whooo" with improved oral resonance for other vowels within this context.  Patient was able to identify that she "opens the throat wide" to improve vocal quality.  Patient able to improve vocal quality with resonant therapy techniques, maintaining oral resonance in words and sentences with 75% accuracy.      Assessment / Recommendations / Plan   Plan Continue with current plan of care     Progression Toward Goals   Progression toward goals Progressing toward goals          SLP Education - 02/21/17 0953    Education provided Yes   Education Details oral resonance   Person(s) Educated Patient   Methods Explanation   Comprehension Verbalized understanding            SLP Long Term Goals - 01/31/17 1447      SLP LONG TERM GOAL #1   Title The patient will demonstrate independent understanding of vocal hygiene concepts and extrinsic laryngeal muscle stretches.     Time 8  Period Weeks   Status New     SLP LONG TERM GOAL #2   Title The patient will be independent for abdominal breathing and breath support exercises.   Time 8   Period Weeks   Status New     SLP LONG TERM GOAL #3   Title The patient will maximize voice quality and loudness using breath support/oral resonance for sustained vowel production, pitch glides, and hierarchal speech drill.   Time 8   Period Weeks   Status New     SLP LONG TERM GOAL #4   Title The patient will maximize voice quality and loudness using breath support/oral resonance for paragraph length recitation with 80% accuracy.   Time 8   Period Weeks    Status New          Plan - 02/21/17 0954    Clinical Impression Statement The patient is generating voice that is less hoarse and strained.  She is able to improve quality and tone focus with semi-occluded vocal tract and improved strength of breath support.  She is consistently generating good vocal quality with "whoo" with improved generalization to other vowels. Today she was able to identify how she improves the vocal quality.   Speech Therapy Frequency 2x / week   Duration Other (comment)   Treatment/Interventions Patient/family education;Other (comment)  Voice therapy   Potential to Achieve Goals Good   Potential Considerations Ability to learn/carryover information;Co-morbidities;Cooperation/participation level;Medical prognosis;Previous level of function;Severity of impairments;Family/community support   SLP Home Exercise Plan extrinsic laryngeal muscle stretches; breath support exercises; lip/tongue trill; straw phonation; oral resonance   Consulted and Agree with Plan of Care Patient      Patient will benefit from skilled therapeutic intervention in order to improve the following deficits and impairments:   Dysphonia    Problem List There are no active problems to display for this patient.  [name], MS/CCC- SLP  Leandrew Koyanagi 02/21/2017, 9:54 AM  [place] [facility] MAIN [facility] SERVICES [address] [place], [place], [address] Phone: [phone]   Fax:  [phone]   Name: Darlene Rollins MRN: [id number] Date of Birth: Nov 01, 1960

## 2017-02-24 ENCOUNTER — Encounter: Payer: Self-pay | Admitting: Speech Pathology

## 2017-02-24 ENCOUNTER — Ambulatory Visit: Payer: BLUE CROSS/BLUE SHIELD | Admitting: Speech Pathology

## 2017-02-24 DIAGNOSIS — R490 Dysphonia: Secondary | ICD-10-CM

## 2017-02-24 NOTE — Therapy (Signed)
[facility] MAIN [facility] SERVICES [address] [place], [place], [address] Phone: [phone]   Fax:  [phone]  Speech Language Pathology Treatment  Patient Details  Name: Darlene Rollins MRN: [id number] Date of Birth: Nov 23, 1960 Referring Provider: Dr. Jenne Campus  Encounter Date: 02/24/2017      End of Session - 02/24/17 1021    Visit Number 7   Number of Visits 17   Date for SLP Re-Evaluation 04/02/17   SLP Start Time 0758   SLP Stop Time  0842   SLP Time Calculation (min) 44 min   Activity Tolerance Patient tolerated treatment well      Past Medical History:  Diagnosis Date  . Allergy   . Asthma   . Diabetes mellitus without complication (HCC)   . GERD (gastroesophageal reflux disease)     Past Surgical History:  Procedure Laterality Date  . cervical surgeries      There were no vitals filed for this visit.      Subjective Assessment - 02/24/17 1020    Subjective "I always had trouble with tension in my voice"   Currently in Pain? No/denies               ADULT SLP TREATMENT - 02/24/17 0001      General Information   Behavior/Cognition Alert;Cooperative;Pleasant mood   HPI 56 year old woman referred by Dr. Jenne Campus for voice therapy.  Per report, laryngeal findings include: erythema resolved, small ulcer on false cord gone, and still using false cords to talk. The patient reports that she was evaluated at [facility] by ENT and SLP/voice therapist in 2016.  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      Treatment Provided   Treatment provided Cognitive-Linquistic     Pain Assessment   Pain Assessment No/denies pain     Cognitive-Linquistic Treatment   Treatment focused on Voice   Skilled Treatment The patient was provided with written and verbal teaching regarding neck, shoulder, tongue, and throat stretches exercises to  promote relaxed phonation. The patient was provided with written and verbal teaching for supplement vocal tract relaxation exercises (straw phonation).  The patient was provided with written and verbal teaching regarding breath support exercises.  Patient instructed in relaxed phonation / oral resonance. Improved oral resonance/relaxed phonation for "whooo" with improved oral resonance for other vowels within this context.  Patient was able to identify that she "opens the throat wide" to improve vocal quality.  Patient able to improve vocal quality with resonant therapy techniques, maintaining oral resonance in sentences and conversation with 75% accuracy. She is not able to generate the good quality "whoo" and shift pitch.     Assessment / Recommendations / Plan   Plan Continue with current plan of care     Progression Toward Goals   Progression toward goals Progressing toward goals          SLP Education - 02/24/17 1020    Education provided Yes   Education Details pitch glides   Person(s) Educated Patient   Methods Explanation   Comprehension Verbalized understanding            SLP Long Term Goals - 01/31/17 1447      SLP LONG TERM GOAL #1   Title The patient will demonstrate independent understanding of vocal hygiene concepts and extrinsic laryngeal muscle stretches.     Time 8  Period Weeks   Status New     SLP LONG TERM GOAL #2   Title The patient will be independent for abdominal breathing and breath support exercises.   Time 8   Period Weeks   Status New     SLP LONG TERM GOAL #3   Title The patient will maximize voice quality and loudness using breath support/oral resonance for sustained vowel production, pitch glides, and hierarchal speech drill.   Time 8   Period Weeks   Status New     SLP LONG TERM GOAL #4   Title The patient will maximize voice quality and loudness using breath support/oral resonance for paragraph length recitation with 80% accuracy.    Time 8   Period Weeks   Status New          Plan - 02/24/17 1021    Clinical Impression Statement The patient is generating voice that is less hoarse and strained.  She is able to improve quality and tone focus with semi-occluded vocal tract, resonant voice therapy techniques, and improved strength of breath support.  She is consistently generating good vocal quality with "whoo" with improved generalization to other vowels. She is not able to improve pitch range.   Speech Therapy Frequency 2x / week   Duration Other (comment)   Treatment/Interventions Patient/family education;Other (comment)  Voice therapy   Potential to Achieve Goals Good   Potential Considerations Ability to learn/carryover information;Co-morbidities;Cooperation/participation level;Medical prognosis;Previous level of function;Severity of impairments;Family/community support   SLP Home Exercise Plan extrinsic laryngeal muscle stretches; breath support exercises; lip/tongue trill; straw phonation; oral resonance; pitch glides   Consulted and Agree with Plan of Care Patient      Patient will benefit from skilled therapeutic intervention in order to improve the following deficits and impairments:   Dysphonia    Problem List There are no active problems to display for this patient.  Dollene PrimroseSusan G Stillman Buenger, MS/CCC- SLP  Leandrew KoyanagiAbernathy, Susie 02/24/2017, 10:23 AM  [place] [facility] [address] [place], [place], [address] Phone: [phone]   Fax:  [phone]   Name: Darlene Rollins MRN: [id number] Date of Birth: 06/27/1961

## 2017-02-28 ENCOUNTER — Encounter: Payer: Self-pay | Admitting: Speech Pathology

## 2017-02-28 ENCOUNTER — Ambulatory Visit: Payer: BLUE CROSS/BLUE SHIELD | Admitting: Speech Pathology

## 2017-02-28 DIAGNOSIS — R490 Dysphonia: Secondary | ICD-10-CM | POA: Diagnosis not present

## 2017-02-28 NOTE — Therapy (Signed)
[place] [facility] [address] [place], [place], [address] Phone: [phone]   Fax:  [phone]  Speech Language Pathology Treatment  Patient Details  Name: Darlene Rollins MRN: [id number] Date of Birth: 03/03/1961 Referring Provider: Dr. Jenne CampusMcQueen  Encounter Date: 02/28/2017      End of Session - 02/28/17 0956    Visit Number 8   Number of Visits 17   Date for SLP Re-Evaluation 04/02/17   SLP Start Time 0900   SLP Stop Time  0950   SLP Time Calculation (min) 50 min   Activity Tolerance Patient tolerated treatment well      Past Medical History:  Diagnosis Date  . Allergy   . Asthma   . Diabetes mellitus without complication (HCC)   . GERD (gastroesophageal reflux disease)     Past Surgical History:  Procedure Laterality Date  . cervical surgeries      There were no vitals filed for this visit.      Subjective Assessment - 02/28/17 0956    Subjective "I always had trouble with tension in my voice"   Currently in Pain? No/denies               ADULT SLP TREATMENT - 02/28/17 0001      General Information   Behavior/Cognition Alert;Cooperative;Pleasant mood   HPI 56 year old woman referred by Dr. Jenne CampusMcQueen for voice therapy.  Per report, laryngeal findings include: erythema resolved, small ulcer on false cord gone, and still using false cords to talk. The patient reports that [facility] and [facility]/voice therapist in 2016.  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      Treatment Provided   Treatment provided Cognitive-Linquistic     Pain Assessment   Pain Assessment No/denies pain     Cognitive-Linquistic Treatment   Treatment focused on Voice   Skilled Treatment The patient was provided with written and verbal teaching regarding neck, shoulder, tongue, and throat stretches exercises to  promote relaxed phonation. The patient was provided with written and verbal teaching for supplement vocal tract relaxation exercises (straw phonation).  The patient was provided with written and verbal teaching regarding breath support exercises.  Patient instructed in relaxed phonation / oral resonance. Improved oral resonance/relaxed phonation for "whooo" with improved oral resonance for other vowels within this context.  Patient was able to identify that she "opens the throat wide" to improve vocal quality.  Patient able to improve vocal quality with resonant therapy techniques, maintaining oral resonance in sentences and conversation with 75% accuracy. She is not able to generate the good quality "whoo" and shift pitch up but could going down and then back up higher.     Assessment / Recommendations / Plan   Plan Continue with current plan of care     Progression Toward Goals   Progression toward goals Progressing toward goals          SLP Education - 02/28/17 0956    Education provided Yes   Education Details pitch glides, vocal loudness   Person(s) Educated Patient   Methods Explanation   Comprehension Verbalized understanding            SLP Long Term Goals - 01/31/17 1447      SLP LONG TERM GOAL #1   Title The patient will demonstrate independent understanding of vocal hygiene  concepts and extrinsic laryngeal muscle stretches.     Time 8   Period Weeks   Status New     SLP LONG TERM GOAL #2   Title The patient will be independent for abdominal breathing and breath support exercises.   Time 8   Period Weeks   Status New     SLP LONG TERM GOAL #3   Title The patient will maximize voice quality and loudness using breath support/oral resonance for sustained vowel production, pitch glides, and hierarchal speech drill.   Time 8   Period Weeks   Status New     SLP LONG TERM GOAL #4   Title The patient will maximize voice quality and loudness using breath support/oral  resonance for paragraph length recitation with 80% accuracy.   Time 8   Period Weeks   Status New          Plan - 02/28/17 3086    Clinical Impression Statement The patient is generating voice that is less hoarse and strained.  She is able to improve quality and tone focus with semi-occluded vocal tract, resonant voice therapy techniques, and improved strength of breath support.  She is consistently generating good vocal quality with "whoo" with improved generalization to other vowels. She reports inconsistent ability to re-set with "whoo", but did today.   Speech Therapy Frequency 2x / week   Duration Other (comment)   Treatment/Interventions Patient/family education;Other (comment)  Voice therapy   Potential to Achieve Goals Good   Potential Considerations Ability to learn/carryover information;Co-morbidities;Cooperation/participation level;Medical prognosis;Previous level of function;Severity of impairments;Family/community support   SLP Home Exercise Plan extrinsic laryngeal muscle stretches; breath support exercises; lip/tongue trill; straw phonation; oral resonance; pitch glides   Consulted and Agree with Plan of Care Patient      Patient will benefit from skilled therapeutic intervention in order to improve the following deficits and impairments:   Dysphonia    Problem List There are no active problems to display for this patient.  [name], MS/CCC- SLP  Leandrew Koyanagi 02/28/2017, 9:57 AM  [place] [facility] MAIN [facility] SERVICES [address] [place], [place], [address] Phone: [phone]   Fax:  [phone]   Name: Darlene Rollins MRN: [id number] Date of Birth: Jul 17, 1961

## 2017-03-07 ENCOUNTER — Ambulatory Visit: Payer: BLUE CROSS/BLUE SHIELD | Admitting: Speech Pathology

## 2017-03-07 ENCOUNTER — Encounter: Payer: Self-pay | Admitting: Speech Pathology

## 2017-03-07 DIAGNOSIS — R490 Dysphonia: Secondary | ICD-10-CM | POA: Diagnosis not present

## 2017-03-07 NOTE — Therapy (Signed)
[place] MAIN [facility] SERVICES [address] [place], [place], [address] Phone: [phone]   Fax:  [phone]  Speech Language Pathology Treatment/Discharge Summary  Patient Details  Name: Darlene Rollins MRN: [id number] Date of Birth: 1960-11-27 Referring Provider: Dr. Tami Ribas  Encounter Date: 03/07/2017      End of Session - 03/07/17 0949    Visit Number 9   Number of Visits 17   Date for SLP Re-Evaluation 04/02/17   SLP Start Time 0900   SLP Stop Time  0945   SLP Time Calculation (min) 45 min   Activity Tolerance Patient tolerated treatment well      Past Medical History:  Diagnosis Date  . Allergy   . Asthma   . Diabetes mellitus without complication ([place])   . GERD (gastroesophageal reflux disease)     Past Surgical History:  Procedure Laterality Date  . cervical surgeries      There were no vitals filed for this visit.      Subjective Assessment - 03/07/17 0948    Subjective "I have to really concentrate to get the oral resonance"   Currently in Pain? No/denies               ADULT SLP TREATMENT - 03/07/17 0001      General Information   Behavior/Cognition Alert;Cooperative;Pleasant mood   HPI 56 year old woman referred by Dr. Tami Ribas for voice therapy.  Per report, laryngeal findings include: erythema resolved, small ulcer on false cord gone, and still using false cords to talk. The patient reports that she was evaluated at [facility] by ENT and SLP/voice therapist in 2016.  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      Treatment Provided   Treatment provided Cognitive-Linquistic     Pain Assessment   Pain Assessment No/denies pain     Cognitive-Linquistic Treatment   Treatment focused on Voice   Skilled Treatment The patient was provided with written and verbal teaching regarding neck, shoulder, tongue, and  throat stretches exercises to promote relaxed phonation. The patient was provided with written and verbal teaching for supplement vocal tract relaxation exercises (straw phonation).  The patient was provided with written and verbal teaching regarding breath support exercises.  Patient instructed in relaxed phonation / oral resonance. Improved oral resonance/relaxed phonation for "whooo" with improved oral resonance for other vowels within this context.  Patient was able to identify that she "opens the throat wide" to improve vocal quality.  Patient able to improve vocal quality with resonant therapy techniques, maintaining oral resonance in sentences and conversation with 75% accuracy. She is not able to generate the good quality "whoo" and shift pitch up but could going down and then back up higher.     Assessment / Recommendations / Plan   Plan Continue with current plan of care;Discharge SLP treatment due to (comment);All goals met     Progression Toward Goals   Progression toward goals Goals met, education completed, patient discharged from [facility] Education - 03/07/17 0949    Education provided Yes   Education Details Oral resonance, vocal loudness   Person(s) Educated Patient   Methods Explanation   Comprehension Verbalized understanding            SLP Long Term Goals - 03/07/17 0951      SLP LONG TERM  GOAL #1   Title The patient will demonstrate independent understanding of vocal hygiene concepts and extrinsic laryngeal muscle stretches.     Status Achieved     SLP LONG TERM GOAL #2   Title The patient will be independent for abdominal breathing and breath support exercises.   Status Achieved     SLP LONG TERM GOAL #3   Title The patient will maximize voice quality and loudness using breath support/oral resonance for sustained vowel production, pitch glides, and hierarchal speech drill.   Status Achieved     SLP LONG TERM GOAL #4   Title The patient will  maximize voice quality and loudness using breath support/oral resonance for paragraph length recitation with 80% accuracy.   Status Achieved          Plan - 03/07/17 0949    Clinical Impression Statement The patient is generating voice that is significantly hoarse and strained.  She is able to improve quality and tone focus with semi-occluded vocal tract, resonant voice therapy techniques, and improved strength of breath support.  She is consistently generating good vocal quality with "whoo" with improved generalization to other vowels. She is aware of what she needs to practice and she is able to maintain improved vocal quality with mental effort.   Speech Therapy Frequency Other (comment)  Discharge   Treatment/Interventions Patient/family education;Other (comment)  Voice therapy   Potential to Achieve Goals Good   Potential Considerations Ability to learn/carryover information;Co-morbidities;Cooperation/participation level;Medical prognosis;Previous level of function;Severity of impairments;Family/community support   SLP Home Exercise Plan extrinsic laryngeal muscle stretches; breath support exercises; lip/tongue trill; straw phonation; oral resonance; pitch glides   Consulted and Agree with Plan of Care Patient      Patient will benefit from skilled therapeutic intervention in order to improve the following deficits and impairments:   Dysphonia    Problem List There are no active problems to display for this patient.  Leroy Sea, MS/CCC- SLP  Lou Miner 03/07/2017, 9:52 AM  [place] MAIN [facility] SERVICES [address] [place], [place], [address] Phone: [phone]   Fax:  [phone]   Name: Darlene Rollins MRN: [id number] Date of Birth: [birth year]

## 2017-10-17 ENCOUNTER — Other Ambulatory Visit: Payer: Self-pay | Admitting: Gastroenterology

## 2017-10-17 DIAGNOSIS — F1590 Other stimulant use, unspecified, uncomplicated: Secondary | ICD-10-CM

## 2017-10-28 ENCOUNTER — Ambulatory Visit
Admission: RE | Admit: 2017-10-28 | Discharge: 2017-10-28 | Disposition: A | Discharge status: Home / Self Care | Payer: BLUE CROSS/BLUE SHIELD | Source: Ambulatory Visit | Attending: Gastroenterology | Admitting: Gastroenterology

## 2017-10-28 DIAGNOSIS — F1590 Other stimulant use, unspecified, uncomplicated: Secondary | ICD-10-CM | POA: Diagnosis not present

## 2017-10-28 MED ORDER — TECHNETIUM TC 99M SULFUR COLLOID
2.0070 | Freq: Once | INTRAVENOUS | Status: AC | PRN
  Administered 2017-10-28: 2.007 via INTRAVENOUS

## 2017-10-30 LAB — GLUCOSE, CAPILLARY: GLUCOSE-CAPILLARY: 120 mg/dL — AB (ref 65–99)

## 2018-01-26 ENCOUNTER — Ambulatory Visit (INDEPENDENT_AMBULATORY_CARE_PROVIDER_SITE_OTHER): Payer: BLUE CROSS/BLUE SHIELD | Admitting: Pulmonary Disease

## 2018-01-26 ENCOUNTER — Telehealth: Payer: Self-pay | Admitting: *Deleted

## 2018-01-26 ENCOUNTER — Encounter: Payer: Self-pay | Admitting: Pulmonary Disease

## 2018-01-26 VITALS — BP 132/88 | HR 102 | Ht 62.5 in | Wt 183.0 lb

## 2018-01-26 DIAGNOSIS — J383 Other diseases of vocal cords: Secondary | ICD-10-CM | POA: Diagnosis not present

## 2018-01-26 DIAGNOSIS — Z8709 Personal history of other diseases of the respiratory system: Secondary | ICD-10-CM

## 2018-01-26 DIAGNOSIS — Z9889 Other specified postprocedural states: Secondary | ICD-10-CM

## 2018-01-26 DIAGNOSIS — R490 Dysphonia: Secondary | ICD-10-CM | POA: Diagnosis not present

## 2018-01-26 NOTE — Progress Notes (Signed)
PULMONARY CONSULT NOTE  Requesting MD/Service: Jenne CampusMcQueen Date of initial consultation: 01/26/18 Reason for consultation: History of asthma, dysphonia  PT PROFILE: 57 y.o. female never smoker with long-standing history of asthma diagnosed in the 1990s and more recently with severe dysphonia.  Referred for consideration of discontinuation of inhaled steroids  HPI:  As above.  She reports that she has had "vocal issues for the past 16 months".  At the onset of this problem, she lost her voice completely for approximately 3 months.  Since that time, she has had a raspy husky voice.  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  Indicates that her "seen voice has been gone since 2012".  She was diagnosed with asthma in the 1990s.  Also diagnosed with allergies and gastroesophageal reflux at that time.  She has been on inhaled steroids (both DPI and aerosol) continuously since initially diagnosed with asthma.  She has also undergone anterior cervical and posterior cervical spine surgery.  Evaluated recently by Dr. Jenne CampusMcQueen and review of his note reveals no evidence of LPR but laryngoscopy did reveal mild posterior chink with incomplete closure.  The specific question is whether the inhaled steroid can be discontinued altogether.  She reports that she has not used her albuterol rescue inhaler in a long time and has minimal respiratory complaints at this time.  She does have upper respiratory allergy symptoms with nasal congestion and posterior nasal drainage. Denies CP, fever, purulent sputum, hemoptysis, LE edema and calf tenderness.  Past Medical History:  Diagnosis Date  . Allergy   . Asthma   . Diabetes mellitus without complication (HCC)   . GERD (gastroesophageal reflux disease)     Past Surgical History:  Procedure Laterality Date  . cervical surgeries      MEDICATIONS: I have reviewed all medications and confirmed regimen as documented  Social History    Socioeconomic History  . Marital status: Single    Spouse name: Not on file  . Number of children: Not on file  . Years of education: Not on file  . Highest education level: Not on file  Occupational History  . Not on file  Social Needs  . Financial resource strain: Not on file  . Food insecurity:    Worry: Not on file    Inability: Not on file  . Transportation needs:    Medical: Not on file    Non-medical: Not on file  Tobacco Use  . Smoking status: Never Smoker  . Smokeless tobacco: Never Used  Substance and Sexual Activity  . Alcohol use: Not on file  . Drug use: Not on file  . Sexual activity: Not on file  Lifestyle  . Physical activity:    Days per week: Not on file    Minutes per session: Not on file  . Stress: Not on file  Relationships  . Social connections:    Talks on phone: Not on file    Gets together: Not on file    Attends religious service: Not on file    Active member of club or organization: Not on file    Attends meetings of clubs or organizations: Not on file    Relationship status: Not on file  . Intimate partner violence:    Fear of current or ex partner: Not on file    Emotionally abused: Not on file    Physically abused: Not on file    Forced sexual activity: Not on file  Other Topics Concern  . Not on file  Social History Narrative  . Not on file    Family History  Problem Relation Age of Onset  . Breast cancer Neg Hx     ROS: No fever, myalgias/arthralgias, unexplained weight loss or weight gain No new focal weakness or sensory deficits No otalgia, hearing loss, visual changes, nasal and sinus symptoms, mouth and throat problems No neck pain or adenopathy No abdominal pain, N/V/D, diarrhea, change in bowel pattern No dysuria, change in urinary pattern   Vitals:   01/26/18 0944 01/26/18 0945  BP:  132/88  Pulse:  (!) 102  SpO2:  98%  Weight:  183 lb (83 kg)  Height: 5' 2.5" (1.588 m)      EXAM:  Gen: WDWN, No overt  respiratory distress HEENT: NCAT, sclera white, oropharynx normal, minimal rhinitis L > R Neck: Surgical scars both anteriorly and posteriorly.  No LAN, thyromegaly, JVD Lungs: breath sounds full with normal percussion note and no adventitious sounds Cardiovascular: RRR, no murmurs noted Abdomen: Soft, nontender, normal BS Ext: without clubbing, cyanosis, edema Neuro: CNs grossly intact, motor and sensory intact Skin: Limited exam, no lesions noted  DATA:   BMP 08/06/2008  Glucose 97  BUN 9  Creatinine 0.85  Sodium 139  Potassium 4.1  Chloride 105  CO2 30  Calcium 9.5    CBC 08/06/2008  WBC 10.3  Hemoglobin 12.5  Hematocrit 38.2  Platelets 505(H)    CXR: Unavailable  I have personally reviewed all chest radiographs reported above including CXRs and CT chest unless otherwise indicated  IMPRESSION:     ICD-10-CM   1. History of asthma Z87.09   2. H/O cervical spine surgery Z98.890   3. Dysphonia R49.0   4. Vocal cord dysfunction J38.3      PLAN:  Stop Advair inhaler Continue montelukast (Singulair) Continue antihistamine therapy as needed (Advil formulation or Claritin) Continue albuterol inhaler as needed for increased shortness of breath, wheezing, chest tightness Follow-up in 4-6 weeks with lung function tests (PFTs) and chest x-ray prior to that visit   Billy Fischer, MD PCCM service Mobile [phone] Pager [phone] 01/26/2018 10:35 AM

## 2018-01-26 NOTE — Patient Instructions (Signed)
Stop Advair inhaler Continue montelukast (Singulair) Continue antihistamine therapy as needed (Advil formulation or Claritin) Continue albuterol inhaler as needed for increased shortness of breath, wheezing, chest tightness Follow-up in 4-6 weeks with lung function tests (PFTs) and chest x-ray prior to that visit

## 2018-01-26 NOTE — Telephone Encounter (Signed)
PT INFORMED DUE TO [place] WARNING RECEIVED THROUGH THE HEALTH SYSTEM, PT REFUSED TO STAY AT FACILITY UNTIL CLEARED. PT STATES SHE UNDERSTANDS THE WARNING BUT WILL GO AHEAD AND LEAVE.

## 2018-01-29 ENCOUNTER — Telehealth: Payer: Self-pay | Admitting: Pulmonary Disease

## 2018-01-29 NOTE — Telephone Encounter (Signed)
Patient calling in regards to PFT Will be unable to make the appt on 4/23 and will need to reschedule - was transferred this morning to speak with PFT scheduling but has still not heard anything back  Please advise

## 2018-01-30 ENCOUNTER — Ambulatory Visit: Payer: BLUE CROSS/BLUE SHIELD

## 2018-01-30 NOTE — Telephone Encounter (Signed)
Pt transferred to RT and R/S [facility] to [facility]  I contacted patient and nothing else needed. Pt did receive PFT Instruction sheet. Rhonda J Cobb

## 2018-02-06 ENCOUNTER — Ambulatory Visit
Admission: RE | Admit: 2018-02-06 | Discharge: 2018-02-06 | Disposition: A | Discharge status: Home / Self Care | Payer: BLUE CROSS/BLUE SHIELD | Source: Ambulatory Visit | Attending: Pulmonary Disease | Admitting: Pulmonary Disease

## 2018-02-06 ENCOUNTER — Ambulatory Visit (HOSPITAL_COMMUNITY): Payer: BLUE CROSS/BLUE SHIELD

## 2018-02-06 DIAGNOSIS — Z8709 Personal history of other diseases of the respiratory system: Secondary | ICD-10-CM

## 2018-02-27 ENCOUNTER — Ambulatory Visit (INDEPENDENT_AMBULATORY_CARE_PROVIDER_SITE_OTHER): Payer: BLUE CROSS/BLUE SHIELD | Admitting: Pulmonary Disease

## 2018-02-27 ENCOUNTER — Encounter: Payer: Self-pay | Admitting: Pulmonary Disease

## 2018-02-27 VITALS — BP 142/88 | HR 93 | Ht 62.5 in | Wt 186.0 lb

## 2018-02-27 DIAGNOSIS — J4530 Mild persistent asthma, uncomplicated: Secondary | ICD-10-CM | POA: Diagnosis not present

## 2018-02-27 DIAGNOSIS — R490 Dysphonia: Secondary | ICD-10-CM | POA: Diagnosis not present

## 2018-02-27 NOTE — Progress Notes (Signed)
PULMONARY OFFICE FOLLOW-UP NOTE  Requesting MD/Service: Jenne Campus Date of initial consultation: 01/26/18 Reason for consultation: History of asthma, dysphonia  PT PROFILE: 57 y.o. female never smoker with long-standing history of asthma diagnosed in the 1990s and more recently with severe dysphonia.  Referred for consideration of discontinuation of inhaled steroids  DATA: 02/06/18 PFTs: Normal spirometry, normal lung volumes, DLCO 70% predicted, DLCO/VA 96% predicted  INTERVAL: No major events  SUBJ:  Last visit, we stopped her inhaled steroid.  She continues on montelukast and loratadine.  She has had no increase in any asthma symptoms.  She rarely requires her albuterol inhaler.  She has also noted no improvement in her voice quality though she does state that she perhaps is able to sing better.  Dr. Jenne Campus has referred her to the [facility] voice clinic. Denies CP, fever, purulent sputum, hemoptysis, LE edema and calf tenderness    Vitals:   02/27/18 0847 02/27/18 0848  BP:  (!) 142/88  Pulse:  93  SpO2:  99%  Weight: 186 lb (84.4 kg)   Height: 5' 2.5" (1.588 m)   Room air  EXAM:  Gen: NAD HEENT: NCAT, sclera white Neck: No JVD Lungs: breath sounds full, no wheezes or other adventitious sounds Cardiovascular: RRR, no murmurs Abdomen: Soft, nontender, normal BS Ext: without clubbing, cyanosis, edema Neuro: grossly intact Skin: Limited exam, no lesions noted   DATA:   BMP 08/06/2008  Glucose 97  BUN 9  Creatinine 0.85  Sodium 139  Potassium 4.1  Chloride 105  CO2 30  Calcium 9.5    CBC 08/06/2008  WBC 10.3  Hemoglobin 12.5  Hematocrit 38.2  Platelets 505(H)    CXR 4/30: No cardiac or pulmonary findings.  Normal chest  I have personally reviewed all chest radiographs reported above including CXRs and CT chest unless otherwise indicated  IMPRESSION:     ICD-10-CM   1. Mild persistent asthma without complication J45.30   2. Dysphonia, perhaps mildly improved  after discontinuation of ICS R49.0      PLAN:  Continue montelukast and Claritin Continue albuterol as needed for increased shortness of breath, cough, wheezing, chest tightness Follow-up in 3 to 4 months or sooner as needed   Billy Fischer, MD PCCM service Mobile [phone] Pager [phone] 02/27/2018 10:57 AM

## 2018-02-27 NOTE — Patient Instructions (Signed)
Continue montelukast and Claritin Continue albuterol as needed for increased shortness of breath, cough, wheezing, chest tightness Follow-up in 3 to 4 months or sooner as needed

## 2018-06-12 ENCOUNTER — Other Ambulatory Visit: Payer: Self-pay | Admitting: Family Medicine

## 2018-06-12 DIAGNOSIS — Z1231 Encounter for screening mammogram for malignant neoplasm of breast: Secondary | ICD-10-CM

## 2018-06-26 ENCOUNTER — Ambulatory Visit
Admission: RE | Admit: 2018-06-26 | Discharge: 2018-06-26 | Disposition: A | Discharge status: Home / Self Care | Payer: BLUE CROSS/BLUE SHIELD | Source: Ambulatory Visit | Attending: Family Medicine | Admitting: Family Medicine

## 2018-06-26 DIAGNOSIS — Z1231 Encounter for screening mammogram for malignant neoplasm of breast: Secondary | ICD-10-CM | POA: Insufficient documentation

## 2018-07-11 ENCOUNTER — Encounter: Payer: Self-pay | Admitting: Pulmonary Disease

## 2018-07-11 ENCOUNTER — Ambulatory Visit: Payer: BLUE CROSS/BLUE SHIELD | Admitting: Pulmonary Disease

## 2018-07-11 VITALS — BP 110/72 | HR 102 | Wt 191.6 lb

## 2018-07-11 DIAGNOSIS — J4530 Mild persistent asthma, uncomplicated: Secondary | ICD-10-CM | POA: Diagnosis not present

## 2018-07-11 DIAGNOSIS — R490 Dysphonia: Secondary | ICD-10-CM | POA: Diagnosis not present

## 2018-07-11 NOTE — Progress Notes (Signed)
PULMONARY OFFICE FOLLOW-UP NOTE  Requesting MD/Service: Jenne Campus Date of initial consultation: 01/26/18 Reason for consultation: History of asthma, dysphonia  PT PROFILE: 57 y.o. female never smoker with long-standing history of asthma diagnosed in the 1990s and more recently with severe dysphonia.  Referred for consideration of discontinuation of inhaled steroids  DATA: 02/06/18 PFTs: Normal spirometry, normal lung volumes, DLCO 70% predicted, DLCO/VA 96% predicted  INTERVAL: No major events  SUBJ:  This is a scheduled reevaluation.  His last visit there have been no major events and no major changes.  She remains off of the inhaled corticosteroid.  She remains on montelukast daily.  She is using loratadine "as needed".  She rarely requires albuterol (approximately 1 time per month).  She continues to have hoarseness and voice change.  She has been extensively evaluated by ENT.  There is no anatomic cause of her symptoms.  She denies CP, fever, purulent sputum, hemoptysis, LE edema and calf tenderness.   Vitals:   07/11/18 0841  BP: 110/72  Pulse: (!) 102  SpO2: 100%  Weight: 191 lb 9.6 oz (86.9 kg)  Room air  EXAM:  Gen: NAD HEENT: NCAT, sclera white Neck: No JVD Lungs: breath sounds full, no wheezes or other adventitious sounds Cardiovascular: RRR, no murmurs Abdomen: Soft, nontender, normal BS Ext: without clubbing, cyanosis, edema Neuro: grossly intact Skin: Limited exam, no lesions noted    DATA:   BMP 08/06/2008  Glucose 97  BUN 9  Creatinine 0.85  Sodium 139  Potassium 4.1  Chloride 105  CO2 30  Calcium 9.5    CBC 08/06/2008  WBC 10.3  Hemoglobin 12.5  Hematocrit 38.2  Platelets 505(H)    CXR: No new film  I have personally reviewed all chest radiographs reported above including CXRs and CT chest unless otherwise indicated  IMPRESSION:     ICD-10-CM   1. Possible mild persistent asthma J45.30   2. Dysphonia R49.0    I suspect she has mild  vocal cord dysfunction syndrome.  She is well adapted to this.  PLAN:  Continue montelukast daily Continue loratadine as needed Continue albuterol as needed Follow-up as needed   Billy Fischer, MD PCCM service Mobile [phone] Pager [phone] 07/11/2018 9:02 AM

## 2018-07-11 NOTE — Patient Instructions (Signed)
Continue Singulair (montelukast) daily Continue Claritin (loratadine) as needed Continue albuterol inhaler as needed for increased wheezing, chest tightness, cough, shortness of breath  Follow-up as needed

## 2020-04-30 ENCOUNTER — Other Ambulatory Visit: Payer: Self-pay | Admitting: Family Medicine

## 2020-04-30 DIAGNOSIS — Z1231 Encounter for screening mammogram for malignant neoplasm of breast: Secondary | ICD-10-CM

## 2020-05-18 ENCOUNTER — Other Ambulatory Visit: Payer: Self-pay | Admitting: Physician Assistant

## 2020-05-18 DIAGNOSIS — I4510 Unspecified right bundle-branch block: Secondary | ICD-10-CM

## 2020-05-18 DIAGNOSIS — E1169 Type 2 diabetes mellitus with other specified complication: Secondary | ICD-10-CM

## 2020-05-18 DIAGNOSIS — R079 Chest pain, unspecified: Secondary | ICD-10-CM

## 2020-05-19 ENCOUNTER — Ambulatory Visit
Admission: RE | Admit: 2020-05-19 | Discharge: 2020-05-19 | Disposition: A | Discharge status: Home / Self Care | Payer: BC Managed Care – PPO | Source: Ambulatory Visit | Attending: Family Medicine | Admitting: Family Medicine

## 2020-05-19 ENCOUNTER — Other Ambulatory Visit: Payer: Self-pay

## 2020-05-19 DIAGNOSIS — Z1231 Encounter for screening mammogram for malignant neoplasm of breast: Secondary | ICD-10-CM | POA: Diagnosis not present

## 2020-05-27 ENCOUNTER — Encounter
Admission: RE | Admit: 2020-05-27 | Discharge: 2020-05-27 | Disposition: A | Discharge status: Home / Self Care | Payer: BC Managed Care – PPO | Source: Ambulatory Visit | Attending: Physician Assistant | Admitting: Physician Assistant

## 2020-05-27 ENCOUNTER — Other Ambulatory Visit: Payer: Self-pay

## 2020-05-27 DIAGNOSIS — I4510 Unspecified right bundle-branch block: Secondary | ICD-10-CM | POA: Diagnosis present

## 2020-05-27 DIAGNOSIS — E1169 Type 2 diabetes mellitus with other specified complication: Secondary | ICD-10-CM | POA: Insufficient documentation

## 2020-05-27 DIAGNOSIS — R079 Chest pain, unspecified: Secondary | ICD-10-CM | POA: Insufficient documentation

## 2020-05-27 MED ORDER — REGADENOSON 0.4 MG/5ML IV SOLN
0.4000 mg | Freq: Once | INTRAVENOUS | Status: AC
  Administered 2020-05-27: 0.4 mg via INTRAVENOUS

## 2020-05-27 MED ORDER — TECHNETIUM TC 99M TETROFOSMIN IV KIT
30.4600 | PACK | Freq: Once | INTRAVENOUS | Status: AC | PRN
  Administered 2020-05-27: 30.46 via INTRAVENOUS

## 2020-05-27 MED ORDER — TECHNETIUM TC 99M TETROFOSMIN IV KIT
10.0000 | PACK | Freq: Once | INTRAVENOUS | Status: AC | PRN
  Administered 2020-05-27: 10.49 via INTRAVENOUS

## 2020-05-28 LAB — NM MYOCAR MULTI W/SPECT W/WALL MOTION / EF
Estimated workload: 1 METS
Exercise duration (min): 1 min
Exercise duration (sec): 12 s
LV dias vol: 53 mL (ref 46–106)
LV sys vol: 19 mL
MPHR: 162 {beats}/min
Peak BP: 77 mmHg
Peak HR: 109 {beats}/min
Percent HR: 67 %
Rest BP: 139 mmHg
Rest HR: 88 {beats}/min
SDS: 0
SRS: 0
SSS: 2
TID: 1.06

## 2020-07-25 ENCOUNTER — Ambulatory Visit: Payer: BC Managed Care – PPO

## 2021-05-10 ENCOUNTER — Other Ambulatory Visit: Payer: Self-pay | Admitting: Family Medicine

## 2021-05-10 DIAGNOSIS — Z1231 Encounter for screening mammogram for malignant neoplasm of breast: Secondary | ICD-10-CM

## 2021-05-12 ENCOUNTER — Encounter: Payer: Self-pay | Admitting: Urology

## 2021-05-12 ENCOUNTER — Ambulatory Visit (INDEPENDENT_AMBULATORY_CARE_PROVIDER_SITE_OTHER): Payer: BC Managed Care – PPO | Admitting: Urology

## 2021-05-12 ENCOUNTER — Other Ambulatory Visit: Payer: Self-pay

## 2021-05-12 VITALS — BP 163/87 | HR 89 | Ht 62.5 in | Wt 185.0 lb

## 2021-05-12 DIAGNOSIS — N3281 Overactive bladder: Secondary | ICD-10-CM

## 2021-05-12 DIAGNOSIS — R339 Retention of urine, unspecified: Secondary | ICD-10-CM | POA: Diagnosis not present

## 2021-05-12 LAB — URINALYSIS, COMPLETE
Bilirubin, UA: NEGATIVE
Glucose, UA: NEGATIVE
Ketones, UA: NEGATIVE
Nitrite, UA: NEGATIVE
RBC, UA: NEGATIVE
Specific Gravity, UA: 1.02 (ref 1.005–1.030)
Urobilinogen, Ur: 0.2 mg/dL (ref 0.2–1.0)
pH, UA: 8.5 — ABNORMAL HIGH (ref 5.0–7.5)

## 2021-05-12 LAB — BLADDER SCAN AMB NON-IMAGING

## 2021-05-12 LAB — MICROSCOPIC EXAMINATION

## 2021-05-12 MED ORDER — OXYBUTYNIN CHLORIDE ER 10 MG PO TB24
10.0000 mg | ORAL_TABLET | Freq: Every day | ORAL | 11 refills | Status: DC
Start: 2021-05-12 — End: 2021-06-16

## 2021-05-12 NOTE — Progress Notes (Deleted)
05/12/2021 8:04 AM   Darlene Rollins 10/30/1960 [id number]  Referring provider: Jerl Mina, MD [address] [facility] [place],  [place] [address]  No chief complaint on file.   HPI:    PMH: Past Medical History:  Diagnosis Date   Allergy    Asthma    Diabetes mellitus without complication (HCC)    GERD (gastroesophageal reflux disease)     Surgical History: Past Surgical History:  Procedure Laterality Date   cervical surgeries      Home Medications:  Allergies as of 05/12/2021   No Known Allergies      Medication List        Accurate as of May 12, 2021  8:04 AM. If you have any questions, ask your nurse or doctor.          buPROPion 300 MG 24 hr tablet Commonly known as: WELLBUTRIN XL Take 300 mg by mouth daily.   CLARITIN-D 24 HOUR PO Take 1 tablet by mouth daily.   Cosamin DS 500-400 MG tablet Generic drug: glucosamine-chondroitin Take 1 tablet by mouth 2 (two) times daily.   ergocalciferol 1.25 MG (50000 UT) capsule Commonly known as: VITAMIN D2 Take 50,000 Units by mouth once a week.   glimepiride 1 MG tablet Commonly known as: AMARYL Take 1 mg by mouth daily.   guaiFENesin 600 MG 12 hr tablet Commonly known as: MUCINEX Take 600 mg by mouth 2 (two) times daily as needed.   Januvia 100 MG tablet Generic drug: sitaGLIPtin Take 100 mg by mouth daily.   lactobacillus acidophilus Tabs tablet Take 1 tablet by mouth daily.   liraglutide 18 MG/3ML Sopn Commonly known as: VICTOZA Inject 18 mg into the skin daily.   metFORMIN 1000 MG tablet Commonly known as: GLUCOPHAGE Take 1,000 mg by mouth 2 (two) times daily with a meal.   multivitamin capsule Take 1 capsule by mouth daily.   naproxen 250 MG tablet Commonly known as: NAPROSYN Take 250 mg by mouth as needed.   ProAir HFA 108 (90 Base) MCG/ACT inhaler Generic drug: albuterol Inhale 2 puffs into the lungs every 4 (four) hours as needed.   RABEprazole 20 MG  tablet Commonly known as: ACIPHEX Take 20 mg by mouth 2 (two) times daily.   Singulair 10 MG tablet Generic drug: montelukast Take 10 mg by mouth at bedtime.   sucralfate 1 g tablet Commonly known as: CARAFATE Take 1 g by mouth 2 (two) times daily.   triamterene-hydrochlorothiazide 37.5-25 MG tablet Commonly known as: MAXZIDE-25 Take 1 tablet by mouth daily.        Allergies: No Known Allergies  Family History: Family History  Problem Relation Age of Onset   Breast cancer Neg Hx     Social History:  reports that she has never smoked. She has never used smokeless tobacco. No history on file for alcohol use and drug use.   Physical Exam: LMP 07/27/2008 (Approximate) Comment: LMP Oct 2009  Constitutional:  Alert and oriented, No acute distress. HEENT: [place] AT, moist mucus membranes.  Trachea midline, no masses. Cardiovascular: No clubbing, cyanosis, or edema. Respiratory: Normal respiratory effort, no increased work of breathing. GI: Abdomen is soft, nontender, nondistended, no abdominal masses GU: No CVA tenderness Lymph: No cervical or inguinal lymphadenopathy. Skin: No rashes, bruises or suspicious lesions. Neurologic: Grossly intact, no focal deficits, moving all 4 extremities. Psychiatric: Normal mood and affect.  Laboratory Data: Lab Results  Component Value Date   WBC 10.3 08/06/2008  HGB 12.5 08/06/2008   HCT 38.2 08/06/2008   MCV 85.0 08/06/2008   PLT 505 (H) 08/06/2008    Lab Results  Component Value Date   CREATININE 0.85 08/06/2008    No results found for: PSA  No results found for: TESTOSTERONE  No results found for: HGBA1C  Urinalysis No results found for: COLORURINE, APPEARANCEUR, LABSPEC, PHURINE, GLUCOSEU, HGBUR, BILIRUBINUR, KETONESUR, PROTEINUR, UROBILINOGEN, NITRITE, LEUKOCYTESUR  No results found for: LABMICR, WBCUA, RBCUA, LABEPIT, MUCUS, BACTERIA  Pertinent Imaging: *** No results found for this or any previous visit.  No  results found for this or any previous visit.  No results found for this or any previous visit.  No results found for this or any previous visit.  No results found for this or any previous visit.  No results found for this or any previous visit.  No results found for this or any previous visit.  No results found for this or any previous visit.   Assessment & Plan:    There are no diagnoses linked to this encounter.  No follow-ups on file.  Riki Altes, MD  [facility] [address] [place], [place] [address] [phone]

## 2021-05-12 NOTE — Progress Notes (Signed)
   05/12/21 9:36 AM   Darlene Rollins 1961-05-23 [id number]  CC: Overactive bladder, nocturia  HPI: I saw [name] today for the above issues.  She is a 60 year old female with a long history of overactive bladder symptoms with urgency and frequency, as well as nocturia 4-6 times overnight.  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  She discontinued that medication when she retired, but her symptoms have become bothersome enough to consider medication again.  She has a sensation of incomplete bladder emptying, but PVR is normal today at 13 mL.  Urinalyses have been benign, most recently on 04/22/2021.  She denies recurrent UTIs.  No history of urinary retention.  She drinks primarily water during the day, and limits fluid prior to bedtime.  She denies any gross hematuria or dysuria.   PMH: Past Medical History:  Diagnosis Date   Allergy    Asthma    Diabetes mellitus without complication (HCC)    GERD (gastroesophageal reflux disease)     Surgical History: Past Surgical History:  Procedure Laterality Date   cervical surgeries      Family History: Family History  Problem Relation Age of Onset   Breast cancer Neg Hx     Social History:  reports that she has never smoked. She has never used smokeless tobacco. No history on file for alcohol use and drug use.  Physical Exam: BP (!) 163/87   Pulse 89   Ht 5' 2.5" (1.588 m)   Wt 185 lb (83.9 kg)   LMP 07/27/2008 (Approximate) Comment: LMP Oct 2009  BMI 33.30 kg/m    Constitutional:  Alert and oriented, No acute distress. Cardiovascular: No clubbing, cyanosis, or edema. Respiratory: Normal respiratory effort, no increased work of breathing. GI: Abdomen is soft, nontender, nondistended, no abdominal masses   Laboratory Data: Reviewed, see HPI  Pertinent Imaging: None to review  Assessment & Plan:   60 year old female with overactive bladder symptoms of  urgency, frequency, and nocturia.  She drinks primarily water during the day.  She has had good results with anticholinergics previously.  We discussed that overactive bladder (OAB) is not a disease, but is a symptom complex that is generally not life-threatening.  Symptoms typically include urinary urgency, frequency, and urge incontinence.  There are numerous treatment options, however there are risks and benefits with both medical and surgical management.  First-line treatment is behavioral therapies including bladder training, pelvic floor muscle training, and fluid management.  Second line treatments include oral antimuscarinics(Ditropan er, Trospium) and beta-3 agonist (Mybetriq). There is typically a period of medication trial (4-8 weeks) to find the optimal therapy and dosing. If symptoms are bothersome despite the above management, third line options include intra-detrusor botox, peripheral tibial nerve stimulation (PTNS), and interstim (SNS). These are more invasive treatments with higher side effect profile, but may improve quality of life for patients with severe OAB symptoms.   Trial of oxybutynin 10 mg XL daily RTC PA 6 weeks symptom check and PVR  Legrand Rams, MD 05/12/2021  [facility] [address], [address] [place], [place] [address] [phone]

## 2021-05-12 NOTE — Patient Instructions (Signed)

## 2021-06-01 ENCOUNTER — Ambulatory Visit
Admission: RE | Admit: 2021-06-01 | Discharge: 2021-06-01 | Disposition: A | Discharge status: Home / Self Care | Payer: BC Managed Care – PPO | Source: Ambulatory Visit | Attending: Family Medicine | Admitting: Family Medicine

## 2021-06-01 ENCOUNTER — Other Ambulatory Visit: Payer: Self-pay

## 2021-06-01 DIAGNOSIS — Z1231 Encounter for screening mammogram for malignant neoplasm of breast: Secondary | ICD-10-CM | POA: Insufficient documentation

## 2021-06-16 ENCOUNTER — Encounter: Payer: Self-pay | Admitting: Physician Assistant

## 2021-06-16 ENCOUNTER — Ambulatory Visit: Payer: BC Managed Care – PPO | Admitting: Physician Assistant

## 2021-06-16 ENCOUNTER — Other Ambulatory Visit: Payer: Self-pay

## 2021-06-16 VITALS — BP 137/87 | HR 90 | Ht 62.0 in | Wt 183.0 lb

## 2021-06-16 DIAGNOSIS — N3281 Overactive bladder: Secondary | ICD-10-CM

## 2021-06-16 LAB — BLADDER SCAN AMB NON-IMAGING

## 2021-06-16 MED ORDER — SOLIFENACIN SUCCINATE 10 MG PO TABS: 10.0000 mg | ORAL_TABLET | Freq: Every day | ORAL | 0 refills | Status: DC

## 2021-06-16 NOTE — Progress Notes (Signed)
06/16/2021 5:53 PM   Darlene Rollins 07-06-1961 [id number]  CC: Chief Complaint  Patient presents with   Over Active Bladder   HPI: Darlene Rollins is a 60 y.o. female with PMH OAB dry who previously responded well to anticholinergics who presents today for symptom recheck on oxybutynin XL 10 mg daily.   Today she reports no change in her urinary symptoms on oxybutynin.  She continues to experience frequency approximately every 90 minutes during the day.  She has nocturia x4-5.  Notably, she has a history of constipation at baseline, but states this is not worsened on oxybutynin.  She is unsure what anticholinergic she took previously, but states this was in the early 2000's.  PVR 23 mL.  PMH: Past Medical History:  Diagnosis Date   Allergy    Asthma    Diabetes mellitus without complication (HCC)    GERD (gastroesophageal reflux disease)     Surgical History: Past Surgical History:  Procedure Laterality Date   cervical surgeries      Home Medications:  Allergies as of 06/16/2021   No Known Allergies      Medication List        Accurate as of June 16, 2021  5:53 PM. If you have any questions, ask your nurse or doctor.          STOP taking these medications    oxybutynin 10 MG 24 hr tablet Commonly known as: DITROPAN-XL Stopped by: Carman Ching, PA-C       TAKE these medications    albuterol 108 (90 Base) MCG/ACT inhaler Commonly known as: VENTOLIN HFA Inhale 2 puffs into the lungs every 4 (four) hours as needed.   buPROPion 300 MG 24 hr tablet Commonly known as: WELLBUTRIN XL Take 300 mg by mouth daily.   CLARITIN-D 24 HOUR PO Take 1 tablet by mouth daily.   ergocalciferol 1.25 MG (50000 UT) capsule Commonly known as: VITAMIN D2 Take 50,000 Units by mouth once a week.   glimepiride 1 MG tablet Commonly known as: AMARYL Take 1 mg by mouth daily.   glucosamine-chondroitin 500-400 MG tablet Take 1 tablet by mouth 2 (two) times  daily.   guaiFENesin 600 MG 12 hr tablet Commonly known as: MUCINEX Take 600 mg by mouth 2 (two) times daily as needed.   lactobacillus acidophilus Tabs tablet Take 1 tablet by mouth daily.   liraglutide 18 MG/3ML Sopn Commonly known as: VICTOZA Inject 18 mg into the skin daily.   metFORMIN 1000 MG tablet Commonly known as: GLUCOPHAGE Take 1,000 mg by mouth 2 (two) times daily with a meal.   montelukast 10 MG tablet Commonly known as: SINGULAIR Take 10 mg by mouth at bedtime.   multivitamin capsule Take 1 capsule by mouth daily.   naproxen 250 MG tablet Commonly known as: NAPROSYN Take 250 mg by mouth as needed.   RABEprazole 20 MG tablet Commonly known as: ACIPHEX Take 20 mg by mouth 2 (two) times daily.   sitaGLIPtin 100 MG tablet Commonly known as: JANUVIA Take 100 mg by mouth daily.   solifenacin 10 MG tablet Commonly known as: VESICARE Take 1 tablet (10 mg total) by mouth daily. Started by: Carman Ching, PA-C   sucralfate 1 g tablet Commonly known as: CARAFATE Take 1 g by mouth 2 (two) times daily.   triamterene-hydrochlorothiazide 37.5-25 MG tablet Commonly known as: MAXZIDE-25 Take 1 tablet by mouth daily.        Allergies:  No Known Allergies  Family  History: Family History  Problem Relation Age of Onset   Breast cancer Neg Hx     Social History:   reports that she has never smoked. She has never used smokeless tobacco. No history on file for alcohol use and drug use.  Physical Exam: BP 137/87   Pulse 90   Ht 5\' 2"  (1.575 m)   Wt 183 lb (83 kg)   LMP 07/27/2008 (Approximate) Comment: LMP Oct 2009  BMI 33.47 kg/m   Constitutional:  Alert and oriented, no acute distress, nontoxic appearing HEENT: [place], AT Cardiovascular: No clubbing, cyanosis, or edema Respiratory: Normal respiratory effort, no increased work of breathing Skin: No rashes, bruises or suspicious lesions Neurologic: Grossly intact, no focal deficits, moving all 4  extremities Psychiatric: Normal mood and affect  Laboratory Data: Results for orders placed or performed in visit on 06/16/21  Bladder Scan (Post Void Residual) in office  Result Value Ref Range   Scan Result 75mL    Assessment & Plan:   1. OAB (overactive bladder) No symptomatic improvement on oxybutynin.  We discussed a trial of an alternative medication.  I offered her another anticholinergic versus a beta agonist.  Patient reported some cost concerns so we will stick with anticholinergics for now.  We will try Vesicare and plan for symptom recheck and PVR in 4 weeks.  Patient is in agreement with this plan. - Bladder Scan (Post Void Residual) in office - solifenacin (VESICARE) 10 MG tablet; Take 1 tablet (10 mg total) by mouth daily.  Dispense: 30 tablet; Refill: 0  Return in about 4 weeks (around 07/14/2021) for Symptom recheck with PVR.  09/13/2021, PA-C  [facility] [address] [place], [place] [phone]

## 2021-06-24 ENCOUNTER — Ambulatory Visit: Payer: Self-pay | Admitting: Physician Assistant

## 2021-07-08 ENCOUNTER — Other Ambulatory Visit: Payer: Self-pay | Admitting: Physician Assistant

## 2021-07-08 DIAGNOSIS — N3281 Overactive bladder: Secondary | ICD-10-CM

## 2021-07-08 MED ORDER — SOLIFENACIN SUCCINATE 10 MG PO TABS: 10.0000 mg | ORAL_TABLET | Freq: Every day | ORAL | 0 refills | Status: DC

## 2021-07-16 ENCOUNTER — Ambulatory Visit: Payer: BC Managed Care – PPO | Admitting: Physician Assistant

## 2021-07-28 ENCOUNTER — Ambulatory Visit: Payer: BC Managed Care – PPO | Admitting: Physician Assistant

## 2021-07-28 ENCOUNTER — Encounter: Payer: Self-pay | Admitting: Physician Assistant

## 2021-07-28 ENCOUNTER — Other Ambulatory Visit: Payer: Self-pay

## 2021-07-28 VITALS — BP 138/85 | HR 89 | Ht 63.0 in | Wt 181.0 lb

## 2021-07-28 DIAGNOSIS — R351 Nocturia: Secondary | ICD-10-CM

## 2021-07-28 DIAGNOSIS — R3916 Straining to void: Secondary | ICD-10-CM | POA: Diagnosis not present

## 2021-07-28 DIAGNOSIS — N3281 Overactive bladder: Secondary | ICD-10-CM | POA: Diagnosis not present

## 2021-07-28 LAB — BLADDER SCAN AMB NON-IMAGING

## 2021-07-28 NOTE — Progress Notes (Signed)
07/28/2021 12:46 PM   [name] Sep 27, 1961 [id number]  CC: Chief Complaint  Patient presents with   Over Active Bladder   HPI: HENRYETTA CORRIVEAU is a 60 y.o. female with PMH diabetes, urinary frequency, and nocturia who presents today for symptom recheck on Vesicare after having previously failed oxybutynin.   Today she reports no change in her urinary symptoms on Vesicare.  She is primarily bothered by her nocturia x3-4.  She reports daytime frequency approximately every 2.5 hours.  Notably, she reports that when she originally sought care in our office, she was not particularly concerned with OAB symptoms.  Rather, she reports a stable, chronic history of urinary intermittency, the sensation of incomplete voiding, and straining with urination.  She also has some double voiding.  These obstructive symptoms are stable on anticholinergics.  She is nulliparous.  No history of pelvic surgery.  No vaginal pressure or fullness.  She has never had a sleep study, but states that she is a snorer and suspect she may have sleep apnea.  Her 2 siblings have sleep apnea and are on CPAP.  PVR 96 mL.  PMH: Past Medical History:  Diagnosis Date   Allergy    Asthma    Diabetes mellitus without complication (HCC)    GERD (gastroesophageal reflux disease)     Surgical History: Past Surgical History:  Procedure Laterality Date   cervical surgeries      Home Medications:  Allergies as of 07/28/2021   No Known Allergies      Medication List        Accurate as of July 28, 2021 12:46 PM. If you have any questions, ask your nurse or doctor.          STOP taking these medications    solifenacin 10 MG tablet Commonly known as: VESICARE Stopped by: Carman Ching, PA-C       TAKE these medications    albuterol 108 (90 Base) MCG/ACT inhaler Commonly known as: VENTOLIN HFA Inhale 2 puffs into the lungs every 4 (four) hours as needed.   buPROPion 300 MG 24 hr  tablet Commonly known as: WELLBUTRIN XL Take 300 mg by mouth daily.   CLARITIN-D 24 HOUR PO Take 1 tablet by mouth daily.   ergocalciferol 1.25 MG (50000 UT) capsule Commonly known as: VITAMIN D2 Take 50,000 Units by mouth once a week.   glimepiride 1 MG tablet Commonly known as: AMARYL Take 1 mg by mouth daily.   glucosamine-chondroitin 500-400 MG tablet Take 1 tablet by mouth 2 (two) times daily.   guaiFENesin 600 MG 12 hr tablet Commonly known as: MUCINEX Take 600 mg by mouth 2 (two) times daily as needed.   lactobacillus acidophilus Tabs tablet Take 1 tablet by mouth daily.   liraglutide 18 MG/3ML Sopn Commonly known as: VICTOZA Inject 18 mg into the skin daily.   metFORMIN 1000 MG tablet Commonly known as: GLUCOPHAGE Take 1,000 mg by mouth 2 (two) times daily with a meal.   montelukast 10 MG tablet Commonly known as: SINGULAIR Take 10 mg by mouth at bedtime.   multivitamin capsule Take 1 capsule by mouth daily.   naproxen 250 MG tablet Commonly known as: NAPROSYN Take 250 mg by mouth as needed.   RABEprazole 20 MG tablet Commonly known as: ACIPHEX Take 20 mg by mouth 2 (two) times daily.   sitaGLIPtin 100 MG tablet Commonly known as: JANUVIA Take 100 mg by mouth daily.   sucralfate 1 g tablet Commonly known  as: CARAFATE Take 1 g by mouth 2 (two) times daily.   triamterene-hydrochlorothiazide 37.5-25 MG tablet Commonly known as: MAXZIDE-25 Take 1 tablet by mouth daily.        Allergies:  No Known Allergies  Family History: Family History  Problem Relation Age of Onset   Breast cancer Neg Hx     Social History:   reports that she has never smoked. She has never used smokeless tobacco. No history on file for alcohol use and drug use.  Physical Exam: BP 138/85   Pulse 89   Ht 5\' 3"  (1.6 m)   Wt 181 lb (82.1 kg)   LMP 07/27/2008 (Approximate) Comment: LMP Oct 2009  BMI 32.06 kg/m   Constitutional:  Alert and oriented, no acute  distress, nontoxic appearing HEENT: [place], AT Cardiovascular: No clubbing, cyanosis, or edema Respiratory: Normal respiratory effort, no increased work of breathing Skin: No rashes, bruises or suspicious lesions Neurologic: Grossly intact, no focal deficits, moving all 4 extremities Psychiatric: Normal mood and affect  Laboratory Data: Results for orders placed or performed in visit on 07/28/21  Bladder Scan (Post Void Residual) in office  Result Value Ref Range   Scan Result 84mL    Assessment & Plan:   1. Nocturia We discussed the role of undiagnosed sleep apnea on nocturia and I encouraged her to follow-up with her PCP to discuss having sleep study done for further evaluation of possible OSA.  She expressed understanding. - Bladder Scan (Post Void Residual) in office  2. Urinary straining On further questioning today, patient is more bothered by her rather obstructive symptoms than frequency or urgency consistent with OAB.  We will plan to discontinue Vesicare at this time and send her for urodynamics with plans for follow-up with Dr. 91m in about 6 weeks.  Patient is in agreement with this plan. - Ambulatory referral to Urology  Return in about 6 weeks (around 09/08/2021) for Urinary straining, intermittency consult with Dr. 09/10/2021 with UDS prior.  Sherron Monday, PA-C  [facility] [address] [place], [place] [phone]

## 2021-07-28 NOTE — Patient Instructions (Signed)
Please discuss undergoing a sleep study with your PCP for possible undiagnosed sleep apnea.

## 2021-08-12 ENCOUNTER — Other Ambulatory Visit: Payer: Self-pay | Admitting: Physician Assistant

## 2021-09-13 ENCOUNTER — Ambulatory Visit (INDEPENDENT_AMBULATORY_CARE_PROVIDER_SITE_OTHER): Payer: BC Managed Care – PPO | Admitting: Urology

## 2021-09-13 ENCOUNTER — Other Ambulatory Visit: Payer: Self-pay

## 2021-09-13 VITALS — BP 140/76 | HR 80 | Ht 63.0 in | Wt 181.0 lb

## 2021-09-13 DIAGNOSIS — R339 Retention of urine, unspecified: Secondary | ICD-10-CM

## 2021-09-13 LAB — BLADDER SCAN AMB NON-IMAGING: Scan Result: 29

## 2021-09-13 MED ORDER — TAMSULOSIN HCL 0.4 MG PO CAPS: 0.4000 mg | ORAL_CAPSULE | Freq: Every day | ORAL | 11 refills | Status: DC

## 2021-09-13 NOTE — Progress Notes (Signed)
09/13/2021 8:59 AM   Darlene Rollins Aug 18, 1961 [id number]  Referring provider: Jerl Mina, MD [address] [facility] [place],  [place] [address]  Chief Complaint  Patient presents with   Follow-up    HPI: NP: Patient has frequency and nocturia.  She is bothered by flow symptoms including straining and incomplete emptying.  She has failed oxybutynin and Vesicare.  Bladder scan 96 mL.  Patient voids every 90 minutes and gets up 3-5 times a night.  No ankle edema.  Might be seeking whether or not she has sleep apnea with primary care  Primary symptoms are flow symptoms.  Sometimes good sometimes poor.  She can stop and start.  Sometimes she does not feel empty.  She can strain to start urination.  Sometimes she strains at the end of urination and can void a small or larger amount that is equal to the first amount.  Has had 2 neck operations.  Has not had a hysterectomy.  She gets 1 or 2 bladder infections a year  Denies a history of bladder surgery and kidney stones.  On urodynamics patient did not void and was catheterized for 100 mL.  Maximum bladder capacity was 500 mL.  Bladder was stable.  No stress incontinence with Valsalva pressure of 116 cm water.  During voluntary voiding she did not generate a detrusor contraction.  She would try to bear down to empty her bladder.  She noted she often has a very strong urge to urinate but then she cannot void.  Squeezing and bearing down is her way of trying to empty.  She could not void in the lab setting.  She did not generate a detrusor contraction.  She went to the restroom and voided 500 mL.  EMG activity in the filling phase normal.  Fluoroscopically bladder neck descent at 1 cm with mild trabeculation.  The details of the urodynamics are signed and dictated  On pelvic examination patient had a suburethral swelling in my opinion within normal limits.  Grade 1 hypermobility the bladder neck and no stress incontinence.  No  prolapse  Bladder scan residual today was 26 mL       PMH: Past Medical History:  Diagnosis Date   Allergy    Asthma    Diabetes mellitus without complication (HCC)    GERD (gastroesophageal reflux disease)     Surgical History: Past Surgical History:  Procedure Laterality Date   cervical surgeries      Home Medications:  Allergies as of 09/13/2021   No Known Allergies      Medication List        Accurate as of September 13, 2021  8:59 AM. If you have any questions, ask your nurse or doctor.          albuterol 108 (90 Base) MCG/ACT inhaler Commonly known as: VENTOLIN HFA Inhale 2 puffs into the lungs every 4 (four) hours as needed.   buPROPion 300 MG 24 hr tablet Commonly known as: WELLBUTRIN XL Take 300 mg by mouth daily.   CLARITIN-D 24 HOUR PO Take 1 tablet by mouth daily.   ergocalciferol 1.25 MG (50000 UT) capsule Commonly known as: VITAMIN D2 Take 50,000 Units by mouth once a week.   glimepiride 1 MG tablet Commonly known as: AMARYL Take 1 mg by mouth daily.   glucosamine-chondroitin 500-400 MG tablet Take 1 tablet by mouth 2 (two) times daily.   guaiFENesin 600 MG 12 hr tablet Commonly known as: MUCINEX Take  600 mg by mouth 2 (two) times daily as needed.   lactobacillus acidophilus Tabs tablet Take 1 tablet by mouth daily.   liraglutide 18 MG/3ML Sopn Commonly known as: VICTOZA Inject 18 mg into the skin daily.   metFORMIN 1000 MG tablet Commonly known as: GLUCOPHAGE Take 1,000 mg by mouth 2 (two) times daily with a meal.   montelukast 10 MG tablet Commonly known as: SINGULAIR Take 10 mg by mouth at bedtime.   multivitamin capsule Take 1 capsule by mouth daily.   naproxen 250 MG tablet Commonly known as: NAPROSYN Take 250 mg by mouth as needed.   RABEprazole 20 MG tablet Commonly known as: ACIPHEX Take 20 mg by mouth 2 (two) times daily.   sitaGLIPtin 100 MG tablet Commonly known as: JANUVIA Take 100 mg by mouth  daily.   sucralfate 1 g tablet Commonly known as: CARAFATE Take 1 g by mouth 2 (two) times daily.   triamterene-hydrochlorothiazide 37.5-25 MG tablet Commonly known as: MAXZIDE-25 Take 1 tablet by mouth daily.   Trulance 3 MG Tabs Generic drug: Plecanatide Take 1 tablet by mouth daily.        Allergies: No Known Allergies  Family History: Family History  Problem Relation Age of Onset   Breast cancer Neg Hx     Social History:  reports that she has never smoked. She has never used smokeless tobacco. No history on file for alcohol use and drug use.  ROS:                                        Physical Exam: LMP 07/27/2008 (Approximate) Comment: LMP Oct 2009    Laboratory Data: Lab Results  Component Value Date   WBC 10.3 08/06/2008   HGB 12.5 08/06/2008   HCT 38.2 08/06/2008   MCV 85.0 08/06/2008   PLT 505 (H) 08/06/2008    Lab Results  Component Value Date   CREATININE 0.85 08/06/2008    No results found for: PSA  No results found for: TESTOSTERONE  No results found for: HGBA1C  Urinalysis    Component Value Date/Time   APPEARANCEUR Hazy (A) 05/12/2021 0854   GLUCOSEU Negative 05/12/2021 0854   BILIRUBINUR Negative 05/12/2021 0854   PROTEINUR 1+ (A) 05/12/2021 0854   NITRITE Negative 05/12/2021 0854   LEUKOCYTESUR 2+ (A) 05/12/2021 0854    Pertinent Imaging:   Assessment & Plan: Pathophysiology of poor flow discussed.  Patient's poor flow was due to poor bladder contractility.  The role of physical therapy and alpha blockers discussed.  She is not in retention and likely would not be a candidate for sacral nerve stimulation.  She understands that her overactive bladder can make the flow symptoms secondarily worse trying to void at lower volumes.  I trial of Myrbetriq or Leslye Peer is reasonable to consider  I will see earlier in about 6 weeks.  Flomax prescribed.  Physical therapy consult given.  In the future I will try the  beta 3 agonist.  She understands treatment options can be limited.  She had no blood in the urine and I did not offer cystoscopy but might consider in the future  There are no diagnoses linked to this encounter.  No follow-ups on file.  Martina Sinner, MD  [facility] [address], [address] [place], [place] [address] [phone]

## 2021-10-21 ENCOUNTER — Other Ambulatory Visit: Payer: Self-pay

## 2021-10-21 ENCOUNTER — Encounter: Payer: Self-pay | Admitting: Physical Therapy

## 2021-10-21 ENCOUNTER — Ambulatory Visit: Payer: BC Managed Care – PPO | Attending: Urology | Admitting: Physical Therapy

## 2021-10-21 DIAGNOSIS — R278 Other lack of coordination: Secondary | ICD-10-CM | POA: Insufficient documentation

## 2021-10-21 DIAGNOSIS — M25561 Pain in right knee: Secondary | ICD-10-CM | POA: Insufficient documentation

## 2021-10-21 DIAGNOSIS — R2689 Other abnormalities of gait and mobility: Secondary | ICD-10-CM | POA: Diagnosis present

## 2021-10-21 DIAGNOSIS — R293 Abnormal posture: Secondary | ICD-10-CM | POA: Insufficient documentation

## 2021-10-21 DIAGNOSIS — M25562 Pain in left knee: Secondary | ICD-10-CM | POA: Diagnosis present

## 2021-10-21 DIAGNOSIS — G8929 Other chronic pain: Secondary | ICD-10-CM | POA: Diagnosis present

## 2021-10-21 DIAGNOSIS — R339 Retention of urine, unspecified: Secondary | ICD-10-CM | POA: Diagnosis not present

## 2021-10-21 NOTE — Therapy (Signed)
[facility] MAIN [facility] [address] [place], [place], [address] Phone: [phone]   Fax:  [phone]  Physical Therapy Evaluation  Patient Details  Name: Darlene Rollins MRN: [id number] Date of Birth: 11/13/1960 Referring Provider (PT): MacDiarmid   Encounter Date: 10/21/2021   PT End of Session - 10/21/21 1734     Visit Number 1    Number of Visits 10    Date for PT Re-Evaluation 12/30/21   eval 10/21/21   PT Start Time 1700    PT Stop Time 1800    PT Time Calculation (min) 60 min             Past Medical History:  Diagnosis Date   Allergy    Asthma    Diabetes mellitus without complication (HCC)    GERD (gastroesophageal reflux disease)    Heart murmur     Past Surgical History:  Procedure Laterality Date   CHOLECYSTECTOMY     uterine cauterine cauterization     2009 due to heavy menstrual bleeding    There were no vitals filed for this visit.    Subjective Assessment - 10/21/21 1715     Subjective 1) difficulty emptying her bladder: Pt had to return to pee again immediately after peeing     2) urinary frequency 2 x per 2 hours. Daily fluid intake: 64 fl oz of water, 8 oz decaf tea with stevia,  no coffee, sometime juice. Pt has the urge to urinate. Urge and frequency has lessened since she started taking Flomax since Dec .    3) Nightime trips to the bathroom: 2-3 x per night but last night she slept 5 hours straight and had to go only 1 x that night.    4) Constipation: 1-2 x per week. Since starting Trulance, pt has daily BMS but consistency of stool range from Type 2-7.  Pt notices fecal seepage.   5) B knee pain with stairs. Pt lives in a 2 story townhome  Pertinent Hx: Hx of broken tailbone without medical treatment after a fall 2002,  hemorrhoids banding. Hx of uterine cauterization in 2009 due to heavy bleeding and rupture of ovarian cyst 1998.   2 neck surgeries, Pt sits at her computer all day at  work. 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 Pt is looking into R shoulder surgery due to limited overhead ROM. Never had children .    Pertinent History Family Hx of CA,   Patient Stated Goals have less consistency with getting up at night. To know when it gets to the urgent stage. feel complete with urination.                [facility] Assessment - 10/21/21 1659       Assessment   Medical Diagnosis incomplete emptying of bladder    Referring Provider (PT) MacDiarmid      Precautions   Precautions None      Restrictions   Weight Bearing Restrictions No      Balance Screen   Has the patient fallen in the past 6 months Yes    How many times? 1    Has the patient had a decrease in activity level because of a fear of falling?  No    Is the patient reluctant to leave their home because of a fear of falling?  No      Home Environment   Living Environment Private residence    Type  of Home House    Home Access Stairs to enter    Entrance Stairs-Number of Steps 1    Home Layout Two level      Prior Function   Level of Independence Independent      Observation/Other Assessments   Observations ankles crossed. poor posture      Posture/Postural Control   Posture Comments hypextended knees, adducted knees      AROM   Overall AROM Comments L sideflexion limited > R      Strength   Overall Strength Comments BLE 4/5, B hip abd 3/5      Palpation   Spinal mobility tenderness/ hypomobilie at upper and lower thoracic spine    SI assessment  levelled iliac crest B      Bed Mobility   Bed Mobility --   modified due to shoulder issues                       Objective measurements completed on examination: See above findings.       [facility] Adult PT Treatment/Exercise - 10/21/21 1759       Therapeutic Activites    Other Therapeutic Activities explanation of anatomy/ physiology, customized POC, goals      Neuro Re-ed    Neuro Re-ed Details  cued  for proper sitting posture, sit to stand, open book                          PT Long Term Goals - 10/21/21 1731       PT LONG TERM GOAL #1   Title Pt will demo proper coordination of deep core system to improve IAP system and complete urination and BMs    Time 4    Period Weeks    Status New    Target Date 11/18/21      PT LONG TERM GOAL #2   Title Pt will improve FOTO score change among one or more  of the categories by > 5 pts  > pt in order to improve QOL    Baseline PFDI  Bowel3  Urinary  Problem2  Bowel  Leakage2  Bowel  Cnst2  PFDI  Urinary3  13 [phone] 17 ( 10/21/20)    Time 10    Period Weeks    Status New    Target Date 12/30/21      PT LONG TERM GOAL #3   Title Pt wil demo proper body mechanics to minimize straining pelvic floor and optimize IAP system to minimize fecal seepage    Time 6    Status New    Target Date 12/02/21      PT LONG TERM GOAL #4   Title Pt will increase hip abduction strength from 3/5 B to > 4/5 in order to increase pelvic girdle stability    Time 8    Period Weeks    Status New    Target Date 12/16/21      PT LONG TERM GOAL #5   Title Pt will demo decreased tenderness and hypomobility at thoracic spine in order to progress to deep core coordination and have optimal pelvic floor lengthening    Time 4    Status New    Target Date 11/18/21      Additional Long Term Goals   Additional Long Term Goals Yes      PT LONG TERM GOAL #6   Title Pt will be referred  to PCP to get sleep study screening to rule in/ out OSA to address non-musculoskeletal source for nocturia    Time 2    Target Date 11/04/21      PT LONG TERM GOAL #7   Title Pt will demo proper knee alignment in standing and with stairs and report decreased B knee pain with stairs by 50%    Time 6    Period Weeks    Status New                    Plan - 10/21/21 1734     Clinical Impression Statement  Pt is a   61 yo  who presents with pelvic  dysfunction of difficulty completing bowel movements and urination, fecal seepage, urinary frequency and urgency, and nocturia. Pt also reported B knee pain which impact her ability to navigate stairs in her 2 story townhome.These deficits impact her QOL and ADLs.  Pt's musculoskeletal assessment revealed hypomobility of thoracic spine with tenderness,  Limited L sideflexion, dyscoordination and strength of pelvic floor mm, B hip abduction weakness, hypextended knees, adducted knees, poor body mechanics which places strain on the abdominal/pelvic floor mm.   These are deficits that indicate an ineffective intraabdominal pressure system associated with increased risk for pt's Sx. Structural stability of lower kinetic chain  and spine will need to be addressed to promote stronger pelvic girdle stability and pelvic floor function.   Pertinent history will be considered into her POC:  Hx of broken tailbone without medical treatment after a fall 2002,  hemorrhoids banding. Hx of uterine cauterization in 2009 due to heavy bleeding and rupture of ovarian cyst 1998.   2 neck surgeries, Pt sits at her computer all day at work. 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 Pt is looking into R shoulder surgery due to limited overhead ROM.    Pt was provided education on etiology of Sx with anatomy, physiology explanation with images along with the benefits of customized pelvic PT Tx based on pt's medical conditions and musculoskeletal deficits.  Explained the physiology of deep core mm coordination and roles of pelvic floor function in urination, defecation, sexual function, and postural control with deep core mm system.    Regional interdependent approaches will yield greater benefits in pt's POC due to the complexity of pt's medical Hx and the significant impact their Sx have had on their QOL.   Following Tx today which pt tolerated without complaints, pt demo'd less tenderness at thoracic spine. Pt  performed proper body mechanics with sitting and sit-to -stand after cues. Plan to address deep core coordination next session and assess pelvic floor / knees at upcoming sessions. Pt benefits from skilled PT.     Examination-Activity Limitations Continence;Toileting    Stability/Clinical Decision Making Evolving/Moderate complexity    Clinical Decision Making Moderate    Rehab Potential Good    PT Frequency 1x / week    PT Duration Other (comment)   10   PT Treatment/Interventions Neuromuscular re-education;Patient/family education;Therapeutic activities;Therapeutic exercise;Stair training;Moist Heat;Biofeedback;Cryotherapy;Scar mobilization;Manual techniques;Manual lymph drainage;Taping;Splinting;Spinal Manipulations;Joint Manipulations;Functional mobility training;Dry needling;ADLs/Self Care Home Management;Balance training;Gait training    Consulted and Agree with Plan of Care Patient             Patient will benefit from skilled therapeutic intervention in order to improve the following deficits and impairments:  Decreased range of motion, Decreased endurance, Decreased activity tolerance, Decreased coordination, Decreased mobility, Decreased scar mobility, Decreased strength, Abnormal gait, Increased muscle  spasms, Postural dysfunction, Difficulty walking, Increased fascial restricitons, Improper body mechanics, Pain, Impaired flexibility, Hypomobility  Visit Diagnosis: Other lack of coordination  Other abnormalities of gait and mobility  Abnormal posture  Chronic pain of left knee  Chronic pain of right knee     Problem List There are no problems to display for this patient.   Mariane Masters, PT 10/21/2021, 6:24 PM  [facility] MAIN [facility] SERVICES [address] [place], [place], [address] Phone: [phone]   Fax:  [phone]  Name: Darlene Rollins MRN: [id number] Date of Birth: [birth year]

## 2021-10-21 NOTE — Patient Instructions (Addendum)
°  Proper body mechanics with getting out of a chair to decrease strain  on back &pelvic floor   Avoid holding your breath when Getting out of the chair:  Scoot to front part of chair chair Heels behind knees, feet are hip width apart, nose over toes  Inhale like you are smelling roses Exhale to stand   ___  Sitting posture with feet on the ground hip width, not crossed  ___ Record your desk set up and we will review

## 2021-11-01 ENCOUNTER — Ambulatory Visit: Payer: BC Managed Care – PPO | Admitting: Urology

## 2021-11-04 ENCOUNTER — Other Ambulatory Visit: Payer: Self-pay

## 2021-11-04 ENCOUNTER — Ambulatory Visit: Payer: BC Managed Care – PPO | Admitting: Physical Therapy

## 2021-11-04 DIAGNOSIS — R293 Abnormal posture: Secondary | ICD-10-CM

## 2021-11-04 DIAGNOSIS — R2689 Other abnormalities of gait and mobility: Secondary | ICD-10-CM

## 2021-11-04 DIAGNOSIS — G8929 Other chronic pain: Secondary | ICD-10-CM

## 2021-11-04 DIAGNOSIS — R278 Other lack of coordination: Secondary | ICD-10-CM

## 2021-11-04 NOTE — Patient Instructions (Signed)
Referral to sleep study with PCP  __  Sit on folded towel placed in the second half of seat to create anterior tilt of pelvis and offload on tailbone    Practice foundation of feet and sitting bones and less leaning back against the back pillow.  __ Try ream of paper under monitors and scoot Left monitor to avoid turning next left too much   ___ Do less rotation in open book exercise, modify hand position for the winging exerisie to not create twinge in shoulder

## 2021-11-04 NOTE — Therapy (Signed)
[facility] MAIN [facility] SERVICES [address] [place], [place], [address] Phone: [phone]   Fax:  [phone]  Physical Therapy Treatment  Patient Details  Name: Darlene Rollins MRN: [id number] Date of Birth: August 23, 1961 Referring Provider (PT): MacDiarmid   Encounter Date: 11/04/2021   PT End of Session - 11/04/21 1713     Visit Number 2    Number of Visits 10    Date for PT Re-Evaluation 12/30/21   eval 10/21/21   PT Start Time 1710    PT Stop Time 1805    PT Time Calculation (min) 55 min             Past Medical History:  Diagnosis Date   Allergy    Asthma    Diabetes mellitus without complication (HCC)    GERD (gastroesophageal reflux disease)    Heart murmur     Past Surgical History:  Procedure Laterality Date   CHOLECYSTECTOMY     uterine cauterine cauterization     2009 due to heavy menstrual bleeding    There were no vitals filed for this visit.   Subjective Assessment - 11/04/21 1713     Subjective Pt reports her night time trips to urinate have decreased 3-5 x a night to 3 x night and pt is sleeping longer hours than before.    Pertinent History Family Hx of CA, 2 neck surgeries, Pt sits at her computer all day at work. 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 Pt is looking into R shoulder surgery due to limited overhead ROM.    Patient Stated Goals have less consistency with getting up at night. To know when it gets to the urgent stage. feel complete with urination.                [facility] PT Assessment - 11/04/21 1715       Observation/Other Assessments   Observations feet on floor without cues, reviewed pictures of her workstation which shoed lower monitors, monitors situated to her L with repeated L neck turns      Posture/Postural Control   Posture Comments less thoracic kyphosis      AROM   Overall AROM Comments B sideflexion AROM      Strength   Overall Strength Comments R hip abd 4-/5       Palpation   SI assessment  R lateral coccgyeus tightness, ischial rami/ischiotuberosity R                           [facility] Adult PT Treatment/Exercise - 11/04/21 1715       Therapeutic Activites    Other Therapeutic Activities explained modifying workseat with ischial tuberosity supported with folded towel / sheet to promote anterior tilt of pelvis      Neuro Re-ed    Neuro Re-ed Details  cued for pelvic tilts to offload on tailbone      Modalities   Modalities Moist Heat      Moist Heat Therapy   Number Minutes Moist Heat 5 Minutes    Moist Heat Location --   sacrum ( unbilled)     Manual Therapy   Manual therapy comments Sidelying: Superior mob at coccyxgeus mm R, rotational mob to promote extension of tailbone/ nutation of sacrum                          PT Long Term  Goals - 10/21/21 1731       PT LONG TERM GOAL #1   Title Pt will demo proper coordination of deep core system to improve IAP system and complete urination and BMs    Time 4    Period Weeks    Status New    Target Date 11/18/21      PT LONG TERM GOAL #2   Title Pt will improve FOTO score change among one or more  of the categories by > 5 pts  > pt in order to improve QOL    Baseline PFDI  Bowel3  Urinary  Problem2  Bowel  Leakage2  Bowel  Cnst2  PFDI  Urinary3  13 58 64 49 17 ( 10/21/20)    Time 10    Period Weeks    Status New    Target Date 12/30/21      PT LONG TERM GOAL #3   Title Pt wil demo proper body mechanics to minimize straining pelvic floor and optimize IAP system to minimize fecal seepage    Time 6    Status New    Target Date 12/02/21      PT LONG TERM GOAL #4   Title Pt will increase hip abduction strength from 3/5 B to > 4/5 in order to increase pelvic girdle stability    Time 8    Period Weeks    Status New    Target Date 12/16/21      PT LONG TERM GOAL #5   Title Pt will demo decreased tenderness and hypomobility at thoracic spine in order  to progress to deep core coordination and have optimal pelvic floor lengthening    Time 4    Status New    Target Date 11/18/21      Additional Long Term Goals   Additional Long Term Goals Yes      PT LONG TERM GOAL #6   Title Pt will be referred to PCP to get sleep study screening to rule in/ out OSA to address non-musculoskeletal source for nocturia    Time 2    Target Date 11/04/21      PT LONG TERM GOAL #7   Title Pt will demo proper knee alignment in standing and with stairs and report decreased B knee pain with stairs by 50%    Time 6    Period Weeks    Status New                   Plan - 11/04/21 1816     Clinical Impression Statement Pt demo'd improved mobility at thoracic spine and had restored B sideflexion AROM. This will help pt's IAP system.   Assessed coccyx which showed R sided tightness at coccygeus and limited extension/ sacral nutation. Pt demo'd improved mobility post Tx. Provided education for anterior tilt of pelvis to minimize sacral sitting.  Provided ergonomic suggestions to promote proper posture and minimize thoracic hypomobility/ sacral sitting.    Will provide standing SIJ stability exercise at next session , withheld due to pt reporting sprained ankle on L yesterday. Assessed L ankle which showed no edema nor weakness in strength in [place]. Encouraged pt to continue wrapping it and RICE.   Pt continues to benefit from skilled PT. Plan to advance to deep core HEP at next session.    Examination-Activity Limitations Continence;Toileting    Stability/Clinical Decision Making Evolving/Moderate complexity    Rehab Potential Good    PT Frequency 1x / week  PT Duration Other (comment)   10   PT Treatment/Interventions Neuromuscular re-education;Patient/family education;Therapeutic activities;Therapeutic exercise;Stair training;Moist Heat;Biofeedback;Cryotherapy;Scar mobilization;Manual techniques;Manual lymph drainage;Taping;Splinting;Spinal  Manipulations;Joint Manipulations;Functional mobility training;Dry needling;ADLs/Self Care Home Management;Balance training;Gait training    Consulted and Agree with Plan of Care Patient             Patient will benefit from skilled therapeutic intervention in order to improve the following deficits and impairments:  Decreased range of motion, Decreased endurance, Decreased activity tolerance, Decreased coordination, Decreased mobility, Decreased scar mobility, Decreased strength, Abnormal gait, Increased muscle spasms, Postural dysfunction, Difficulty walking, Increased fascial restricitons, Improper body mechanics, Pain, Impaired flexibility, Hypomobility  Visit Diagnosis: Other lack of coordination  Other abnormalities of gait and mobility  Abnormal posture  Chronic pain of left knee  Chronic pain of right knee     Problem List There are no problems to display for this patient.   Mariane Masters, PT 11/04/2021, 6:36 PM  [facility] MAIN [facility] SERVICES [address] [place], [place], [address] Phone: [phone]   Fax:  [phone]  Name: FAATIMAH SPIELBERG MRN: [id number] Date of Birth: 1961/09/30

## 2021-11-11 ENCOUNTER — Ambulatory Visit: Payer: BC Managed Care – PPO | Attending: Urology | Admitting: Physical Therapy

## 2021-11-11 ENCOUNTER — Other Ambulatory Visit: Payer: Self-pay

## 2021-11-11 DIAGNOSIS — G8929 Other chronic pain: Secondary | ICD-10-CM | POA: Insufficient documentation

## 2021-11-11 DIAGNOSIS — R278 Other lack of coordination: Secondary | ICD-10-CM | POA: Diagnosis present

## 2021-11-11 DIAGNOSIS — R2689 Other abnormalities of gait and mobility: Secondary | ICD-10-CM | POA: Diagnosis present

## 2021-11-11 DIAGNOSIS — M25561 Pain in right knee: Secondary | ICD-10-CM | POA: Insufficient documentation

## 2021-11-11 DIAGNOSIS — R293 Abnormal posture: Secondary | ICD-10-CM | POA: Insufficient documentation

## 2021-11-11 DIAGNOSIS — M25562 Pain in left knee: Secondary | ICD-10-CM | POA: Insufficient documentation

## 2021-11-11 NOTE — Patient Instructions (Addendum)
Deep core level 1-2 (handout)  2 x day  __   Head/ shoulder./ elbow pressed 5 sec holds, elbows bent , chin tuck  Knees bent,   10 reps

## 2021-11-11 NOTE — Therapy (Signed)
[facility]IN [facility] [address] [place], [place], [address] Phone: [phone]   Fax:  [phone]  Physical Therapy Treatment  Patient Details  Name: Darlene Rollins MRN: [id number] Date of Birth: 06/24/1961 Referring Provider (PT): MacDiarmid   Encounter Date: 11/11/2021   PT End of Session - 11/11/21 1740     Visit Number 3    Number of Visits 10    Date for PT Re-Evaluation 12/30/21   eval 10/21/21   PT Start Time 1659    PT Stop Time 1755    PT Time Calculation (min) 56 min             Past Medical History:  Diagnosis Date   Allergy    Asthma    Diabetes mellitus without complication (HCC)    GERD (gastroesophageal reflux disease)    Heart murmur     Past Surgical History:  Procedure Laterality Date   CHOLECYSTECTOMY     uterine cauterine cauterization     2009 due to heavy menstrual bleeding    There were no vitals filed for this visit.   Subjective Assessment - 11/11/21 1703     Subjective Pt's PCP has submitted pt to get a home sleep study test. Pt does not need to use a towel under buttocks because she is already sitting on her sitting bones in her ergonomic chair.  Pt modified her workstation and raised the computer higher and moved the monitors closer to center.    Pertinent History Family Hx of CA, 2 neck surgeries, Pt sits at her computer all day at work. 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 Pt is looking into R shoulder surgery due to limited overhead ROM.    Patient Stated Goals have less consistency with getting up at night. To know when it gets to the urgent stage. feel complete with urination.                [facility] PT Assessment - 11/11/21 1708       Observation/Other Assessments   Observations less forward head      Coordination   Coordination and Movement Description excessive overuse of ab with deep core      Palpation   Spinal mobility tightness along lateral /anterior  ribs with limited diaphramgatic excursion                           [facility] Adult PT Treatment/Exercise - 11/11/21 1740       Neuro Re-ed    Neuro Re-ed Details  cued for more posterior /lateral /anterior excursion of diaphragm,  deep core 1-2, relaxation practice to minimize upper shoulder mm overuse      Modalities   Modalities Moist Heat      Moist Heat Therapy   Moist Heat Location --   back, during instruction and practice of deep core HEP     Manual Therapy   Manual therapy comments STM/MWM at B posterior ribs/ scapular mm attachments                          PT Long Term Goals - 10/21/21 1731       PT LONG TERM GOAL #1   Title Pt will demo proper coordination of deep core system to improve IAP system and complete urination and BMs    Time 4    Period Weeks  Status New    Target Date 11/18/21      PT LONG TERM GOAL #2   Title Pt will improve FOTO score change among one or more  of the categories by > 5 pts  > pt in order to improve QOL    Baseline PFDI  Bowel3  Urinary  Problem2  Bowel  Leakage2  Bowel  Cnst2  PFDI  Urinary3  13 58 64 49 17 ( 10/21/20)    Time 10    Period Weeks    Status New    Target Date 12/30/21      PT LONG TERM GOAL #3   Title Pt wil demo proper body mechanics to minimize straining pelvic floor and optimize IAP system to minimize fecal seepage    Time 6    Status New    Target Date 12/02/21      PT LONG TERM GOAL #4   Title Pt will increase hip abduction strength from 3/5 B to > 4/5 in order to increase pelvic girdle stability    Time 8    Period Weeks    Status New    Target Date 12/16/21      PT LONG TERM GOAL #5   Title Pt will demo decreased tenderness and hypomobility at thoracic spine in order to progress to deep core coordination and have optimal pelvic floor lengthening    Time 4    Status New    Target Date 11/18/21      Additional Long Term Goals   Additional Long Term Goals Yes      PT  LONG TERM GOAL #6   Title Pt will be referred to PCP to get sleep study screening to rule in/ out OSA to address non-musculoskeletal source for nocturia    Time 2    Target Date 11/04/21      PT LONG TERM GOAL #7   Title Pt will demo proper knee alignment in standing and with stairs and report decreased B knee pain with stairs by 50%    Time 6    Period Weeks    Status New                   Plan - 11/11/21 1741     Clinical Impression Statement Pt demo'd improved thoracic mobility but still required further manual Tx to increase diaphragmatic excursion. Pt achieved improved diaphragmatic with cues for less straining with abdominal mm.   Added cervical/scapular retraction strengthening in hooklying position to further help eliminate forward head posture which yield longer lasting outcomes with pelvic floor training and to improve her posture prior to her shoulder surgery which is coming up in mid March.  Pt continues to  benefit from skilled PT. Plan to assess pelvic floor and build propioception of pelvic floor movement with deep core at next session.      Examination-Activity Limitations Continence;Toileting    Stability/Clinical Decision Making Evolving/Moderate complexity    Rehab Potential Good    PT Frequency 1x / week    PT Duration Other (comment)   10   PT Treatment/Interventions Neuromuscular re-education;Patient/family education;Therapeutic activities;Therapeutic exercise;Stair training;Moist Heat;Biofeedback;Cryotherapy;Scar mobilization;Manual techniques;Manual lymph drainage;Taping;Splinting;Spinal Manipulations;Joint Manipulations;Functional mobility training;Dry needling;ADLs/Self Care Home Management;Balance training;Gait training    Consulted and Agree with Plan of Care Patient             Patient will benefit from skilled therapeutic intervention in order to improve the following deficits and impairments:  Decreased range of motion, Decreased  endurance,  Decreased activity tolerance, Decreased coordination, Decreased mobility, Decreased scar mobility, Decreased strength, Abnormal gait, Increased muscle spasms, Postural dysfunction, Difficulty walking, Increased fascial restricitons, Improper body mechanics, Pain, Impaired flexibility, Hypomobility  Visit Diagnosis: Other lack of coordination  Other abnormalities of gait and mobility  Abnormal posture  Chronic pain of left knee  Chronic pain of right knee     Problem List There are no problems to display for this patient.   Mariane Masters, PT 11/11/2021, 5:56 PM  [facility] MAIN [facility] SERVICES [address] [place], [place], [address] Phone: [phone]   Fax:  [phone]  Name: Darlene Rollins MRN: [id number] Date of Birth: [birth year]

## 2021-11-19 ENCOUNTER — Other Ambulatory Visit: Payer: Self-pay

## 2021-11-19 ENCOUNTER — Ambulatory Visit: Payer: BC Managed Care – PPO | Admitting: Physical Therapy

## 2021-11-19 DIAGNOSIS — M25562 Pain in left knee: Secondary | ICD-10-CM

## 2021-11-19 DIAGNOSIS — R293 Abnormal posture: Secondary | ICD-10-CM

## 2021-11-19 DIAGNOSIS — R2689 Other abnormalities of gait and mobility: Secondary | ICD-10-CM

## 2021-11-19 DIAGNOSIS — M25561 Pain in right knee: Secondary | ICD-10-CM

## 2021-11-19 DIAGNOSIS — G8929 Other chronic pain: Secondary | ICD-10-CM

## 2021-11-19 DIAGNOSIS — R278 Other lack of coordination: Secondary | ICD-10-CM

## 2021-11-19 NOTE — Patient Instructions (Signed)
Stretch for pelvic floor   V- slides  "v heels slide away and then back toward buttocks and then rock knee to slight ,  slide heel along at 11 o clock away from buttocks   10 reps   _  Figure-4  Seated 45 deg to to the R, with R knee bent , trunk leaning forward   5 breaths  Repeat on the other side  __   When doing deep core level 1-2   Focus on feet planted 4 corners, knees pointed up to ceiling, Not letting the up ab be dominate on exhalation Lower hand shoulder soften down first then the up belly

## 2021-11-19 NOTE — Therapy (Signed)
[place] MAIN [facility] SERVICES [address] [place], [place], [address] Phone: [phone]   Fax:  [phone]  Physical Therapy Treatment  Patient Details  Name: DAYLENE VANDENBOSCH MRN: [id number] Date of Birth: 1961/01/01 Referring Provider (PT): MacDiarmid   Encounter Date: 11/19/2021   PT End of Session - 11/19/21 0836     Visit Number 4    Number of Visits 10    Date for PT Re-Evaluation 12/30/21   eval 10/21/21   PT Start Time 0757    PT Stop Time 0900    PT Time Calculation (min) 63 min    Activity Tolerance Patient tolerated treatment well    Behavior During Therapy [facility] for tasks assessed/performed             Past Medical History:  Diagnosis Date   Allergy    Asthma    Diabetes mellitus without complication ([place])    GERD (gastroesophageal reflux disease)    Heart murmur     Past Surgical History:  Procedure Laterality Date   CHOLECYSTECTOMY     uterine cauterine cauterization     2009 due to heavy menstrual bleeding    There were no vitals filed for this visit.   Subjective Assessment - 11/19/21 0800     Subjective Pt's surgeon told her she will get a shoulder repair instead of replacement. Pt practiced the deep core HEP daily. .    Pertinent History Family Hx of CA, 2 neck surgeries, Pt sits at her computer all day at work. 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 Pt is looking into R shoulder surgery due to limited overhead ROM.    Patient Stated Goals have less consistency with getting up at night. To know when it gets to the urgent stage. feel complete with urination.                [facility] PT Assessment - 11/19/21 0850       Palpation   Palpation comment tightness at R adductor, leg, and hypomobile foot limited in DF/EV/ toe extension                        Pelvic Floor Special Questions - 11/19/21 0802     External Perineal Exam through clothing, tenderness/ tightness at anterior  pelvic floor mm R, attachments at pubic symphysis ( decreased post Tx)               [facility] Adult PT Treatment/Exercise - 11/19/21 7517       Neuro Re-ed    Neuro Re-ed Details  cued for less ab overuse      Modalities   Modalities Moist Heat      Moist Heat Therapy   Number Minutes Moist Heat 10 Minutes    Moist Heat Location --   perineum, during explanation of regional dependent approach to help address ankles and feet to minimize pelvic floor tightness up the chain and to minimize ankle sprains and improve balance.     Manual Therapy   Manual therapy comments STM/MWM to decrease pelvic floor tightness                          PT Long Term Goals - 11/19/21 0902       PT LONG TERM GOAL #1   Title Pt will demo proper coordination of deep core system to improve IAP system and complete urination  and BMs    Time 4    Period Weeks    Status Partially Met    Target Date 11/18/21      PT LONG TERM GOAL #2   Title Pt will improve FOTO score change among one or more  of the categories by > 5 pts  > pt in order to improve QOL    Baseline PFDI  Bowel3  Urinary  Problem2  Bowel  Leakage2  Bowel  Cnst2  PFDI  Urinary3  13 58 64 49 17 ( 10/21/20)    Time 10    Period Weeks    Status On-going    Target Date 12/30/21      PT LONG TERM GOAL #3   Title Pt wil demo proper body mechanics to minimize straining pelvic floor and optimize IAP system to minimize fecal seepage    Time 6    Status On-going    Target Date 12/02/21      PT LONG TERM GOAL #4   Title Pt will increase hip abduction strength from 3/5 B to > 4/5 in order to increase pelvic girdle stability    Time 8    Period Weeks    Status On-going    Target Date 12/16/21      PT LONG TERM GOAL #5   Title Pt will demo decreased tenderness and hypomobility at thoracic spine in order to progress to deep core coordination and have optimal pelvic floor lengthening    Time 4    Status Achieved    Target Date  11/18/21      PT LONG TERM GOAL #6   Title Pt will be referred to PCP to get sleep study screening to rule in/ out OSA to address non-musculoskeletal source for nocturia    Time 2    Status On-going    Target Date 11/04/21      PT LONG TERM GOAL #7   Title Pt will demo proper knee alignment in standing and with stairs and report decreased B knee pain with stairs by 50%    Time 6    Period Weeks    Status On-going                   Plan - 11/19/21 1610     Clinical Impression Statement Pt demo'd tight R anterior pelvic floor mm which  decreased following external manual Tx. Pressure and technique was modified when pt reported tenderness. Pt required propioception cues for feet and was explained how her hypomobility of  lower kinetic chain is associated with tight pelvic floor mm and will be address with regional interdependent approach. Plan to address hypomobile ankles and her Hx of poor balance. Pt demo'd proper lengthening of pelvic floor which will help with elimination of urine. Plan to continue address tightness of pelvic floor and lower kinetic chain at upcoming session. Pt continues to benefit from skilled PT.   Examination-Activity Limitations Continence;Toileting    Stability/Clinical Decision Making Evolving/Moderate complexity    Rehab Potential Good    PT Frequency 1x / week    PT Duration Other (comment)   10   PT Treatment/Interventions Neuromuscular re-education;Patient/family education;Therapeutic activities;Therapeutic exercise;Stair training;Moist Heat;Biofeedback;Cryotherapy;Scar mobilization;Manual techniques;Manual lymph drainage;Taping;Splinting;Spinal Manipulations;Joint Manipulations;Functional mobility training;Dry needling;ADLs/Self Care Home Management;Balance training;Gait training    Consulted and Agree with Plan of Care Patient             Patient will benefit from skilled therapeutic intervention in order to improve the following  deficits and  impairments:  Decreased range of motion, Decreased endurance, Decreased activity tolerance, Decreased coordination, Decreased mobility, Decreased scar mobility, Decreased strength, Abnormal gait, Increased muscle spasms, Postural dysfunction, Difficulty walking, Increased fascial restricitons, Improper body mechanics, Pain, Impaired flexibility, Hypomobility  Visit Diagnosis: Other lack of coordination  Other abnormalities of gait and mobility  Abnormal posture  Chronic pain of left knee  Chronic pain of right knee     Problem List There are no problems to display for this patient.   Jerl Mina, PT 11/19/2021, 9:02 AM  [place] MAIN [facility] SERVICES [address] [place], [place], [address] Phone: [phone]   Fax:  [phone]  Name: KELECHI ASTARITA MRN: [id number] Date of Birth: 02-09-1961

## 2021-11-25 ENCOUNTER — Other Ambulatory Visit: Payer: Self-pay

## 2021-11-25 ENCOUNTER — Ambulatory Visit: Payer: BC Managed Care – PPO | Admitting: Physical Therapy

## 2021-11-25 DIAGNOSIS — R2689 Other abnormalities of gait and mobility: Secondary | ICD-10-CM

## 2021-11-25 DIAGNOSIS — R293 Abnormal posture: Secondary | ICD-10-CM

## 2021-11-25 DIAGNOSIS — G8929 Other chronic pain: Secondary | ICD-10-CM

## 2021-11-25 DIAGNOSIS — R278 Other lack of coordination: Secondary | ICD-10-CM | POA: Diagnosis not present

## 2021-11-25 NOTE — Patient Instructions (Signed)
Laser Fingers  Pillow under hips/ belly   if needed for decreased low back pain  Palms face midline by hips  Finger tips shooting straight down  Imagine holding pencil under your armpits Draw shoulders away from ears Inhale Exhale lift chest up slightly without feeling it in your back. The bend happens in the midback  (keep chin tucked)   ___  Low cobra: ( modified with chin tuck)   On belly, palms under armpits, elbows pointed to ceiling  Inhale: lengthen crown of the head away from shoulders Exhale, feel belly hug in and press palms into the floor, squeezing elbows/shoulder blades towards each other while chest lifts about 5 cm off of the floor. You should feel the hinging movement at mid back and not the low back.   ____     Feet slides :  to increase your feet mobility to go /up anddown stairs better '   Points of contact at sitting bones  Four points of contact of foot, Heel up, ankle not twist out Lower heel Four points of contact of foot, Slide foot back   Repeated with other foot   ___  Stairs with attention to knee alignment      __

## 2021-11-25 NOTE — Therapy (Signed)
[place] [facility] MAIN [facility] SERVICES [address] [place], [place], [address] Phone: [phone]   Fax:  [phone]  Physical Therapy Treatment  Patient Details  Name: Darlene Rollins MRN: [id number] Date of Birth: [birth year] Referring Provider (PT): MacDiarmid   Encounter Date: 11/25/2021   PT End of Session - 11/25/21 0811     Visit Number 5    Number of Visits 10    Date for PT Re-Evaluation 12/30/21   eval 10/21/21   PT Start Time 0803    PT Stop Time 0900    PT Time Calculation (min) 57 min    Activity Tolerance Patient tolerated treatment well    Behavior During Therapy [facility] for tasks assessed/performed             Past Medical History:  Diagnosis Date   Allergy    Asthma    Diabetes mellitus without complication (HCC)    GERD (gastroesophageal reflux disease)    Heart murmur     Past Surgical History:  Procedure Laterality Date   CHOLECYSTECTOMY     uterine cauterine cauterization     2009 due to heavy menstrual bleeding    There were no vitals filed for this visit.   Subjective Assessment - 11/25/21 0903     Subjective Things are better, . She thinks her medicaitons are helpful. She is emptying her bowels and urine better. Pt has gotten up after sleeping 4 hours straight which is longer than before. Pt can tell she is sleeping better as well because she is getting ready faster in the morning.                [facility] PT Assessment - 11/25/21 0817       Coordination   Coordination and Movement Description poor scapulocervical activation ( forward head)      Strength   Overall Strength Comments B hip abd 4+/5      Palpation   Palpation comment tightness between ray I-II B feet, R lateral ankle,      Ambulation/Gait   Stairs Yes    Stair Management Technique One rail Right;Alternating pattern   hyperextended knees on descension                       Pelvic Floor Special Questions - 11/25/21 0841      External Perineal Exam through clothing, tenderness/ tightness at anterior pelvic floor mm R, R bulbospongiosus / deep transverse perineal mm,  attachments at pubic symphysis ( decreased post Tx)               [facility] Adult PT Treatment/Exercise - 11/25/21 0845       Therapeutic Activites    Other Therapeutic Activities explained lower kinetic chain deficits association with pelvic floor and knee pain, cued for more knee flexion with stairs      Neuro Re-ed    Neuro Re-ed Details  cued for seated feet mobility HEP and knee alignment for less knee pain when using stairs      Manual Therapy   Manual therapy comments STM/MWM to decrease pelvic floor tightness, R lateral ankle/ between ray I-II of B feet                          PT Long Term Goals - 11/25/21 5015       PT LONG TERM GOAL #1   Title Pt will demo proper coordination  of deep core system to improve IAP system and complete urination and BMs    Time 4    Period Weeks    Status Achieved    Target Date 11/18/21      PT LONG TERM GOAL #2   Title Pt will improve FOTO score change among one or more  of the categories by > 5 pts  > pt in order to improve QOL    Baseline PFDI  Bowel3  Urinary  Problem2  Bowel  Leakage2  Bowel  Cnst2  PFDI  Urinary3  13 58 64 49 17 ( 10/21/20)    Time 10    Period Weeks    Status On-going    Target Date 12/30/21      PT LONG TERM GOAL #3   Title Pt wil demo proper body mechanics to minimize straining pelvic floor and optimize IAP system to minimize fecal seepage    Time 6    Status Achieved    Target Date 12/02/21      PT LONG TERM GOAL #4   Title Pt will increase hip abduction strength from 3/5 B to > 4/5 in order to increase pelvic girdle stability    Time 8    Period Weeks    Status Achieved    Target Date 12/16/21      PT LONG TERM GOAL #5   Title Pt will demo decreased tenderness and hypomobility at thoracic spine in order to progress to deep core  coordination and have optimal pelvic floor lengthening    Time 4    Status Achieved    Target Date 11/18/21      PT LONG TERM GOAL #6   Title Pt will be referred to PCP to get sleep study screening to rule in/ out OSA to address non-musculoskeletal source for nocturia    Time 2    Status On-going    Target Date 11/04/21      PT LONG TERM GOAL #7   Title Pt will demo proper knee alignment in standing and with stairs and report decreased B knee pain with stairs by 50%    Time 6    Period Weeks    Status Achieved                   Plan - 11/25/21 1610     Clinical Impression Statement Pt required manual Tx to decrease anterior pelvic floor mm tightness after which pt demo'd more upward movement of pelvic floor. Progressed pt to cervico-scapulothoracic stabilization exercises with cues required. Pt requires skilled PT for one more visit and plan d/c at next session as pt hafe made improvements. Pt reports improved emptying of urine and she will be preparing for shoulder surgery in March.    Examination-Activity Limitations Continence;Toileting    Stability/Clinical Decision Making Evolving/Moderate complexity    Rehab Potential Good    PT Frequency 1x / week    PT Duration Other (comment)   10   PT Treatment/Interventions Neuromuscular re-education;Patient/family education;Therapeutic activities;Therapeutic exercise;Stair training;Moist Heat;Biofeedback;Cryotherapy;Scar mobilization;Manual techniques;Manual lymph drainage;Taping;Splinting;Spinal Manipulations;Joint Manipulations;Functional mobility training;Dry needling;ADLs/Self Care Home Management;Balance training;Gait training    Consulted and Agree with Plan of Care Patient             Patient will benefit from skilled therapeutic intervention in order to improve the following deficits and impairments:  Decreased range of motion, Decreased endurance, Decreased activity tolerance, Decreased coordination, Decreased  mobility, Decreased scar mobility, Decreased strength, Abnormal gait, Increased muscle  spasms, Postural dysfunction, Difficulty walking, Increased fascial restricitons, Improper body mechanics, Pain, Impaired flexibility, Hypomobility  Visit Diagnosis: Other lack of coordination  Other abnormalities of gait and mobility  Abnormal posture  Chronic pain of left knee  Chronic pain of right knee     Problem List There are no problems to display for this patient.   Mariane Masters, PT 11/25/2021, 4:29 PM  [place] [facility] MAIN [facility] SERVICES [address] [place], [place], [address] Phone: [phone]   Fax:  [phone]  Name: Darlene Rollins MRN: [id number] Date of Birth: Oct 24, 1960

## 2021-11-29 ENCOUNTER — Ambulatory Visit (INDEPENDENT_AMBULATORY_CARE_PROVIDER_SITE_OTHER): Payer: BC Managed Care – PPO | Admitting: Urology

## 2021-11-29 ENCOUNTER — Other Ambulatory Visit: Payer: Self-pay

## 2021-11-29 VITALS — BP 130/70 | HR 75 | Ht 63.0 in | Wt 181.0 lb

## 2021-11-29 DIAGNOSIS — R3912 Poor urinary stream: Secondary | ICD-10-CM

## 2021-11-29 LAB — URINALYSIS, COMPLETE
Bilirubin, UA: NEGATIVE
Glucose, UA: NEGATIVE
Ketones, UA: NEGATIVE
Nitrite, UA: NEGATIVE
Protein,UA: NEGATIVE
RBC, UA: NEGATIVE
Specific Gravity, UA: 1.02 (ref 1.005–1.030)
Urobilinogen, Ur: 0.2 mg/dL (ref 0.2–1.0)
pH, UA: 7 (ref 5.0–7.5)

## 2021-11-29 LAB — MICROSCOPIC EXAMINATION

## 2021-11-29 MED ORDER — TAMSULOSIN HCL 0.4 MG PO CAPS: 0.4000 mg | ORAL_CAPSULE | Freq: Every day | ORAL | 3 refills | Status: DC

## 2021-11-29 NOTE — Addendum Note (Signed)
Addended by: Alvera Novel on: 11/29/2021 11:09 AM   Modules accepted: Orders

## 2021-11-29 NOTE — Progress Notes (Signed)
11/29/2021 10:56 AM   Darlene Rollins [birth year] [id number]  Referring provider: Jerl Mina, MD [address] [facility] [place],  [place] [address]  Chief Complaint  Patient presents with   Over Active Bladder    HPI: NP: Patient has frequency and nocturia.  She is bothered by flow symptoms including straining and incomplete emptying.  She has failed oxybutynin and Vesicare.  Bladder scan 96 mL.   Patient voids every 90 minutes and gets up 3-5 times a night.  No ankle edema.  Might be seeking whether or not she has sleep apnea with primary care  Primary symptoms are flow symptoms.  Sometimes good sometimes poor.  She can stop and start.  Sometimes she does not feel empty.  She can strain to start urination.  Sometimes she strains at the end of urination and can void a small or larger amount that is equal to the first amount.  Has had 2 neck operations.  Has not had a hysterectomy.  She gets 1 or 2 bladder infections a year  On urodynamics patient did not void and was catheterized for 100 mL.  Maximum bladder capacity was 500 mL.  Bladder was stable.  No stress incontinence with Valsalva pressure of 116 cm water.  During voluntary voiding she did not generate a detrusor contraction.  She would try to bear down to empty her bladder.  She noted she often has a very strong urge to urinate but then she cannot void.  Squeezing and bearing down is her way of trying to empty.  She could not void in the lab setting.  She did not generate a detrusor contraction.  She went to the restroom and voided 500 mL.  EMG activity in the filling phase normal.  Fluoroscopically bladder neck descent at 1 cm with mild trabeculation.    On pelvic examination patient had a suburethral swelling in my opinion within normal limits.  Grade 1 hypermobility the bladder neck and no stress incontinence.  No prolapse  Bladder scan residual today was 26 mL    Pathophysiology of poor flow discussed.  Patient's  poor flow was due to poor bladder contractility.  The role of physical therapy and alpha blockers discussed.  She is not in retention and likely would not be a candidate for sacral nerve stimulation.  She understands that her overactive bladder can make the flow symptoms secondarily worse trying to void at lower volumes.  I trial of Myrbetriq or Leslye Peer is reasonable to consider   I will see earlier in about 6 weeks.  Flomax prescribed.  Physical therapy consult given.  In the future I will try the beta 3 agonist.  She understands treatment options can be limited.  She had no blood in the urine and I did not offer cystoscopy but might consider in the future  Today Flow dramatically better on Flomax and physical therapy.  Getting up once or twice a night which is a big improvement.  Feels much more rested.   PMH: Past Medical History:  Diagnosis Date   Allergy    Asthma    Diabetes mellitus without complication (HCC)    GERD (gastroesophageal reflux disease)    Heart murmur     Surgical History: Past Surgical History:  Procedure Laterality Date   CHOLECYSTECTOMY     uterine cauterine cauterization     2009 due to heavy menstrual bleeding    Home Medications:  Allergies as of 11/29/2021  Reactions   Molds & Smuts         Medication List        Accurate as of November 29, 2021 10:56 AM. If you have any questions, ask your nurse or doctor.          STOP taking these medications    guaiFENesin 600 MG 12 hr tablet Commonly known as: MUCINEX Stopped by: Martina Sinner, MD       TAKE these medications    albuterol 108 (90 Base) MCG/ACT inhaler Commonly known as: VENTOLIN HFA Inhale 2 puffs into the lungs every 4 (four) hours as needed.   buPROPion 300 MG 24 hr tablet Commonly known as: WELLBUTRIN XL Take 300 mg by mouth daily.   CLARITIN-D 24 HOUR PO Take 1 tablet by mouth daily.   cyanocobalamin 1000 MCG/ML injection Commonly known as: (VITAMIN  B-12) Inject 1,000 mcg into the muscle every 30 (thirty) days.   Dulaglutide 0.75 MG/0.5ML Sopn Inject into the skin.   ergocalciferol 1.25 MG (50000 UT) capsule Commonly known as: VITAMIN D2 Take 50,000 Units by mouth once a week.   glimepiride 1 MG tablet Commonly known as: AMARYL Take 1 mg by mouth daily.   glucosamine-chondroitin 500-400 MG tablet Take 1 tablet by mouth 2 (two) times daily.   lactobacillus acidophilus Tabs tablet Take 1 tablet by mouth daily.   liraglutide 18 MG/3ML Sopn Commonly known as: VICTOZA Inject 18 mg into the skin daily.   metFORMIN 1000 MG tablet Commonly known as: GLUCOPHAGE Take 1,000 mg by mouth 2 (two) times daily with a meal.   montelukast 10 MG tablet Commonly known as: SINGULAIR Take 10 mg by mouth at bedtime.   multivitamin capsule Take 1 capsule by mouth daily.   naproxen 250 MG tablet Commonly known as: NAPROSYN Take 250 mg by mouth as needed.   RABEprazole 20 MG tablet Commonly known as: ACIPHEX Take 20 mg by mouth 2 (two) times daily.   ReliOn Pen Needles 31G X 6 MM Misc Generic drug: Insulin Pen Needle See admin instructions.   sitaGLIPtin 100 MG tablet Commonly known as: JANUVIA Take 100 mg by mouth daily.   sucralfate 1 g tablet Commonly known as: CARAFATE Take 1 g by mouth 2 (two) times daily.   tamsulosin 0.4 MG Caps capsule Commonly known as: FLOMAX Take 1 capsule (0.4 mg total) by mouth daily.   triamterene-hydrochlorothiazide 37.5-25 MG tablet Commonly known as: MAXZIDE-25 Take 1 tablet by mouth daily.   Trulance 3 MG Tabs Generic drug: Plecanatide Take 1 tablet by mouth daily.        Allergies:  Allergies  Allergen Reactions   Molds & Smuts     Family History: Family History  Problem Relation Age of Onset   Cancer Mother    Cancer Maternal Grandmother    Cancer Maternal Grandfather    Cancer Paternal Grandmother    Breast cancer Neg Hx     Social History:  reports that she has  never smoked. She has never used smokeless tobacco. No history on file for alcohol use and drug use.  ROS:                                        Physical Exam: LMP 07/27/2008 (Approximate) Comment: LMP Oct 2009  Constitutional:  Alert and oriented, No acute distress.   Laboratory Data: Lab Results  Component Value  Date   WBC 10.3 08/06/2008   HGB 12.5 08/06/2008   HCT 38.2 08/06/2008   MCV 85.0 08/06/2008   PLT 505 (H) 08/06/2008    Lab Results  Component Value Date   CREATININE 0.85 08/06/2008    No results found for: PSA  No results found for: TESTOSTERONE  No results found for: HGBA1C  Urinalysis    Component Value Date/Time   APPEARANCEUR Hazy (A) 05/12/2021 0854   GLUCOSEU Negative 05/12/2021 0854   BILIRUBINUR Negative 05/12/2021 0854   PROTEINUR 1+ (A) 05/12/2021 0854   NITRITE Negative 05/12/2021 0854   LEUKOCYTESUR 2+ (A) 05/12/2021 0854    Pertinent Imaging:   Assessment & Plan: Flomax renewed 90x3 and I will see in 1 year she has surgery on her shoulder in March  There are no diagnoses linked to this encounter.  No follow-ups on file.  Martina Sinner, MD  [facility] [address], [address] [place], [place] [address] [phone]

## 2021-11-29 NOTE — Addendum Note (Signed)
Addended by: Veneta Penton on: 11/29/2021 01:05 PM   Modules accepted: Orders

## 2021-12-02 ENCOUNTER — Other Ambulatory Visit: Payer: Self-pay | Admitting: Surgery

## 2021-12-03 ENCOUNTER — Other Ambulatory Visit: Payer: Self-pay

## 2021-12-03 ENCOUNTER — Ambulatory Visit: Payer: BC Managed Care – PPO | Admitting: Physical Therapy

## 2021-12-03 DIAGNOSIS — R278 Other lack of coordination: Secondary | ICD-10-CM | POA: Diagnosis not present

## 2021-12-03 DIAGNOSIS — G8929 Other chronic pain: Secondary | ICD-10-CM

## 2021-12-03 DIAGNOSIS — R2689 Other abnormalities of gait and mobility: Secondary | ICD-10-CM

## 2021-12-03 DIAGNOSIS — R293 Abnormal posture: Secondary | ICD-10-CM

## 2021-12-03 NOTE — Patient Instructions (Signed)
Feet care :  Self -feet massage  ? ?Handshake : fingers between toes, moving ballmounds/toes back and forth several times while other hand anchors at arch. Do the same at the hind/mid foot.  ?Heel to toes upward to a letter Big Letter T strokes to spread ballmounds and toes, several times, pinch between webs of toes  ?Run finger tips along top of foot between long bones "comb between the bones"  ? ? ?Wiggle toes and spread them out when relaxing  ? ?

## 2021-12-03 NOTE — Therapy (Signed)
[place] MAIN [facility] SERVICES [address] [place], [place], [address] Phone: [phone]   Fax:  [phone]  Physical Therapy Treatment / Discharge Summary across 6 visits  Patient Details  Name: Darlene Rollins MRN: [id number] Date of Birth: 1961/06/12 Referring Provider (PT): MacDiarmid   Encounter Date: 12/03/2021   PT End of Session - 12/03/21 0808     Visit Number 6    Number of Visits 10    Date for PT Re-Evaluation 12/30/21   eval 10/21/21   PT Start Time 0800    PT Stop Time 0900    PT Time Calculation (min) 60 min    Activity Tolerance Patient tolerated treatment well    Behavior During Therapy [facility] for tasks assessed/performed             Past Medical History:  Diagnosis Date   Allergy    Asthma    Diabetes mellitus without complication ([place])    GERD (gastroesophageal reflux disease)    Heart murmur     Past Surgical History:  Procedure Laterality Date   CHOLECYSTECTOMY     uterine cauterine cauterization     2009 due to heavy menstrual bleeding    There were no vitals filed for this visit.   Subjective Assessment - 12/03/21 0807     Subjective Pt is still sleeping for 4 hours straight. Pt has slept for 5 hours one night.  Pt reports she has not had leakage for 2 weeks.    Pertinent History Family Hx of CA, 2 neck surgeries, Pt sits at her computer all day at work. 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 Pt is looking into R shoulder surgery due to limited overhead ROM.                [facility] PT Assessment - 12/03/21 0854       Palpation   Palpation comment hypomobile R navicular/ intermediate cuneiform , tightness between rays I-II-III,plantar fascia                        Pelvic Floor Special Questions - 12/03/21 0857     External Perineal Exam through clothing, less mm pelvic floor  tightness on R               [facility] Adult PT Treatment/Exercise - 12/03/21 0858        Therapeutic Activites    Other Therapeutic Activities administered FOTO , reassessed goals , provided pain science educaiton to prepare pt for her shoulder surgery with better pain management      Neuro Re-ed    Neuro Re-ed Details  cued for feet massage , wider feet      Manual Therapy   Manual therapy comments PA/AP mob grade II-III at midfoot bones, STM/MWM at intrinsic feet mm on R to promote at toe abduction/ toe ext                          PT Long Term Goals - 12/03/21 0816       PT LONG TERM GOAL #1   Title Pt will demo proper coordination of deep core system to improve IAP system and complete urination and BMs    Time 4    Period Weeks    Status Achieved    Target Date 11/18/21      PT LONG TERM GOAL #2   Title Pt will  improve FOTO score change among one or more  of the categories by > 5 pts  > pt in order to improve QOL    Baseline PFDI  Bowel3  Urinary  Problem2  Bowel  Leakage2  Bowel  Cnst2  PFDI  Urinary3  13 58 64 49 17 ( 10/21/20)   ( 12/03/22: Bowel Constipation 6 pts change)    Time 10    Period Weeks    Status Achieved    Target Date 12/30/21      PT LONG TERM GOAL #3   Title Pt wil demo proper body mechanics to minimize straining pelvic floor and optimize IAP system to minimize fecal seepage    Time 6    Status Achieved    Target Date 12/02/21      PT LONG TERM GOAL #4   Title Pt will increase hip abduction strength from 3/5 B to > 4/5 in order to increase pelvic girdle stability    Time 8    Period Weeks    Status Achieved    Target Date 12/16/21      PT LONG TERM GOAL #5   Title Pt will demo decreased tenderness and hypomobility at thoracic spine in order to progress to deep core coordination and have optimal pelvic floor lengthening    Time 4    Status Achieved    Target Date 11/18/21      PT LONG TERM GOAL #6   Title Pt will be referred to PCP to get sleep study screening to rule in/ out OSA to address non-musculoskeletal source  for nocturia    Time 2    Status Achieved    Target Date 11/04/21      PT LONG TERM GOAL #7   Title Pt will demo proper knee alignment in standing and with stairs and report decreased B knee pain with stairs by 50%    Time 6    Period Weeks    Status Achieved                   Plan - 12/03/21 [id number]     Clinical Impression Statement Pt achieved 100% goals and reports no more leakage the past 2 weeks. Pt also reports less frequency and also sleeping through the night with longer hours but she will still undergo a sleep study as advised.   Pt's posture improved significantly with less forward head and thoracic kyphosis and less pelvic misalignment. Pt also is IND with deep core HEP which will help with less leakage. Pt shows less tightness at pelvic floor mm and lower kinetic chain which will help with pelvic function, balance, and gait. Pt's lower kinetic chain has been addressed as pt reports less  knee pain with stairs. Pt is ready for discharge at this time.    Examination-Activity Limitations Continence;Toileting    Stability/Clinical Decision Making Evolving/Moderate complexity    Rehab Potential Good    PT Frequency 1x / week    PT Duration Other (comment)   10   PT Treatment/Interventions Neuromuscular re-education;Patient/family education;Therapeutic activities;Therapeutic exercise;Stair training;Moist Heat;Biofeedback;Cryotherapy;Scar mobilization;Manual techniques;Manual lymph drainage;Taping;Splinting;Spinal Manipulations;Joint Manipulations;Functional mobility training;Dry needling;ADLs/Self Care Home Management;Balance training;Gait training    Consulted and Agree with Plan of Care Patient             Patient will benefit from skilled therapeutic intervention in order to improve the following deficits and impairments:  Decreased range of motion, Decreased endurance, Decreased activity tolerance, Decreased coordination, Decreased mobility, Decreased scar  mobility,  Decreased strength, Abnormal gait, Increased muscle spasms, Postural dysfunction, Difficulty walking, Increased fascial restricitons, Improper body mechanics, Pain, Impaired flexibility, Hypomobility  Visit Diagnosis: Other lack of coordination  Other abnormalities of gait and mobility  Abnormal posture  Chronic pain of left knee  Chronic pain of right knee     Problem List There are no problems to display for this patient.   Jerl Mina, PT 12/03/2021, 9:03 AM  [place] MAIN [facility] SERVICES [address] [place], [place], [address] Phone: [phone]   Fax:  [phone]  Name: ANAIRA LAURENZO MRN: [id number] Date of Birth: [birth year]

## 2021-12-16 ENCOUNTER — Other Ambulatory Visit: Payer: Self-pay

## 2021-12-16 ENCOUNTER — Encounter
Admission: RE | Admit: 2021-12-16 | Discharge: 2021-12-16 | Disposition: A | Discharge status: Home / Self Care | Payer: BC Managed Care – PPO | Source: Ambulatory Visit | Attending: Surgery | Admitting: Surgery

## 2021-12-16 VITALS — Ht 62.5 in | Wt 190.0 lb

## 2021-12-16 DIAGNOSIS — Z01818 Encounter for other preprocedural examination: Secondary | ICD-10-CM | POA: Insufficient documentation

## 2021-12-16 DIAGNOSIS — Z79899 Other long term (current) drug therapy: Secondary | ICD-10-CM | POA: Insufficient documentation

## 2021-12-16 NOTE — Patient Instructions (Addendum)
Your procedure is scheduled on: Wednesday 12/22/21 ?Report to the Registration Desk on the 1st floor of the [facility]. ?To find out your arrival time, please call [phone] between 1PM - 3PM on: Tuesday 12/21/21 ? ?REMEMBER: ?Instructions that are not followed completely may result in serious medical risk, up to and including death; or upon the discretion of your surgeon and anesthesiologist your surgery may need to be rescheduled. ? ?Do not eat food after midnight the night before surgery.  ?No gum chewing, lozengers or hard candies. ? ?You may however, drink water up to 2 hours before you are scheduled to arrive for your surgery. Do not drink anything within 2 hours of your scheduled arrival time. ? ?Type 1 and Type 2 diabetics should only drink water. ? ?TAKE THESE MEDICATIONS THE MORNING OF SURGERY WITH A SIP OF WATER: ?buPROPion (WELLBUTRIN XL) 300 MG 24 hr tablet ?tamsulosin (FLOMAX) 0.4 MG CAPS capsule ? ?RABEprazole (ACIPHEX) 20 MG tablet (take one the night before and one on the morning of surgery - helps to prevent nausea after surgery.) ?Stop metFORMIN (GLUCOPHAGE) 1000 MG tablet 2 days prior to surgery. Take last dose on Sunday 12/19/21. ?Use your albuterol (VENTOLIN HFA) 108 (90 Base) MCG/ACT inhaler on the day of surgery and bring to the hospital. ? ?One week prior to surgery: ?Stop taking your Chlorphen-Phenyleph-Ibuprofen (ADVIL ALLERGY & CONGESTION) 4-10-200 MG TABS, Naproxen Sod-diphenhydrAMINE (ALEVE PM) 220-25 MG TABS and any other Anti-inflammatories (NSAIDS) such as Advil, Aleve, Ibuprofen, Motrin, Naproxen, Naprosyn and Aspirin based products such as Excedrin, Goodys Powder, BC Powder. ?Stop taking your calcium carbonate (TUMS EX) 750 MG chewable tablet, Multiple Vitamin (MULTIVITAMIN) capsule, Vitamin D, Ergocalciferol, (DRISDOL) 1.25 MG (50000 UNIT) CAPS capsule and ANY other OVER THE COUNTER supplements until after surgery. ?You may however, continue to take Tylenol if needed for pain  up until the day of surgery. ? ?No Alcohol for 24 hours before or after surgery. ? ?No Smoking including e-cigarettes for 24 hours prior to surgery.  ?No chewable tobacco products for at least 6 hours prior to surgery.  ?No nicotine patches on the day of surgery. ? ?Do not use any "recreational" drugs for at least a week prior to your surgery.  ?Please be advised that the combination of cocaine and anesthesia may have negative outcomes, up to and including death. ?If you test positive for cocaine, your surgery will be cancelled. ? ?On the morning of surgery brush your teeth with toothpaste and water, you may rinse your mouth with mouthwash if you wish. ?Do not swallow any toothpaste or mouthwash. ? ?Use CHG Soap as directed on instruction sheet. ? ?Do not wear jewelry, make-up, hairpins, clips or nail polish. ? ?Do not wear lotions, powders, or perfumes.  ? ?Do not shave body from the neck down 48 hours prior to surgery just in case you cut yourself which could leave a site for infection.  ?Also, freshly shaved skin may become irritated if using the CHG soap. ? ?Do not bring valuables to the hospital. [facility] is not responsible for any missing/lost belongings or valuables.  ? ?Notify your doctor if there is any change in your medical condition (cold, fever, infection). ? ?Wear comfortable clothing (specific to your surgery type) to the hospital. ? ?If you are being discharged the day of surgery, you will not be allowed to drive home. ?You will need a responsible adult (18 years or older) to drive you home and stay with you that night.  ? ?  If you are taking public transportation, you will need to have a responsible adult (18 years or older) with you. ?Please confirm with your physician that it is acceptable to use public transportation.  ? ?Please call the [facility] Dept. at [phone] if you have any questions about these instructions. ? ?Surgery Visitation Policy: ? ?Patients undergoing a  surgery or procedure may have one family member or support person with them as long as that person is not COVID-19 positive or experiencing its symptoms.  ?That person may remain in the waiting area during the procedure and may rotate out with other people. ? ?Inpatient Visitation:   ? ?Visiting hours are 7 a.m. to 8 p.m. ?Up to two visitors ages 16+ are allowed at one time in a patient room. The visitors may rotate out with other people during the day. Visitors must check out when they leave, or other visitors will not be allowed. One designated support person may remain overnight. ?The visitor must pass COVID-19 screenings, use hand sanitizer when entering and exiting the patient?s room and wear a mask at all times, including in the patient?s room. ?Patients must also wear a mask when staff or their visitor are in the room. ?Masking is required regardless of vaccination status.  ?

## 2021-12-17 ENCOUNTER — Encounter: Payer: Self-pay | Admitting: Urgent Care

## 2021-12-17 ENCOUNTER — Encounter
Admission: RE | Admit: 2021-12-17 | Discharge: 2021-12-17 | Disposition: A | Discharge status: Home / Self Care | Payer: BC Managed Care – PPO | Source: Ambulatory Visit | Attending: Surgery | Admitting: Surgery

## 2021-12-17 DIAGNOSIS — Z01818 Encounter for other preprocedural examination: Secondary | ICD-10-CM | POA: Diagnosis not present

## 2021-12-17 DIAGNOSIS — Z79899 Other long term (current) drug therapy: Secondary | ICD-10-CM | POA: Diagnosis not present

## 2021-12-17 LAB — POTASSIUM: Potassium: 3.6 mmol/L (ref 3.5–5.1)

## 2021-12-21 MED ORDER — CEFAZOLIN SODIUM-DEXTROSE 2-4 GM/100ML-% IV SOLN
2.0000 g | INTRAVENOUS | Status: AC
  Administered 2021-12-22: 2 g via INTRAVENOUS

## 2021-12-21 MED ORDER — SODIUM CHLORIDE 0.9 % IV SOLN: INTRAVENOUS | Status: DC

## 2021-12-21 MED ORDER — ORAL CARE MOUTH RINSE: 15.0000 mL | Freq: Once | OROMUCOSAL | Status: AC

## 2021-12-21 MED ORDER — CHLORHEXIDINE GLUCONATE 0.12 % MT SOLN: 15.0000 mL | Freq: Once | OROMUCOSAL | Status: AC

## 2021-12-22 ENCOUNTER — Other Ambulatory Visit: Payer: Self-pay

## 2021-12-22 ENCOUNTER — Ambulatory Visit: Payer: BC Managed Care – PPO | Admitting: Certified Registered"

## 2021-12-22 ENCOUNTER — Encounter: Payer: Self-pay | Admitting: Surgery

## 2021-12-22 ENCOUNTER — Ambulatory Visit
Admission: RE | Admit: 2021-12-22 | Discharge: 2021-12-22 | Disposition: A | Discharge status: Home / Self Care | Payer: BC Managed Care – PPO | Attending: Surgery | Admitting: Surgery

## 2021-12-22 ENCOUNTER — Ambulatory Visit: Payer: BC Managed Care – PPO

## 2021-12-22 ENCOUNTER — Encounter
Admission: RE | Disposition: A | Discharge status: Home / Self Care | Payer: Self-pay | Source: Home / Self Care | Attending: Surgery

## 2021-12-22 DIAGNOSIS — M7521 Bicipital tendinitis, right shoulder: Secondary | ICD-10-CM | POA: Insufficient documentation

## 2021-12-22 DIAGNOSIS — Z981 Arthrodesis status: Secondary | ICD-10-CM | POA: Diagnosis not present

## 2021-12-22 DIAGNOSIS — E119 Type 2 diabetes mellitus without complications: Secondary | ICD-10-CM | POA: Insufficient documentation

## 2021-12-22 DIAGNOSIS — M75111 Incomplete rotator cuff tear or rupture of right shoulder, not specified as traumatic: Secondary | ICD-10-CM | POA: Insufficient documentation

## 2021-12-22 DIAGNOSIS — M19011 Primary osteoarthritis, right shoulder: Secondary | ICD-10-CM | POA: Insufficient documentation

## 2021-12-22 DIAGNOSIS — K219 Gastro-esophageal reflux disease without esophagitis: Secondary | ICD-10-CM | POA: Insufficient documentation

## 2021-12-22 DIAGNOSIS — J45909 Unspecified asthma, uncomplicated: Secondary | ICD-10-CM | POA: Diagnosis not present

## 2021-12-22 DIAGNOSIS — M7531 Calcific tendinitis of right shoulder: Secondary | ICD-10-CM | POA: Insufficient documentation

## 2021-12-22 DIAGNOSIS — Z9049 Acquired absence of other specified parts of digestive tract: Secondary | ICD-10-CM | POA: Diagnosis not present

## 2021-12-22 DIAGNOSIS — Z419 Encounter for procedure for purposes other than remedying health state, unspecified: Secondary | ICD-10-CM

## 2021-12-22 LAB — GLUCOSE, CAPILLARY
Glucose-Capillary: 176 mg/dL — ABNORMAL HIGH (ref 70–99)
Glucose-Capillary: 189 mg/dL — ABNORMAL HIGH (ref 70–99)

## 2021-12-22 SURGERY — ARTHROSCOPY, SHOULDER
Anesthesia: General | Site: Shoulder | Laterality: Right

## 2021-12-22 MED ORDER — FENTANYL CITRATE (PF) 100 MCG/2ML IJ SOLN
INTRAMUSCULAR | Status: AC
  Filled 2021-12-22: qty 2

## 2021-12-22 MED ORDER — SUGAMMADEX SODIUM 200 MG/2ML IV SOLN
INTRAVENOUS | Status: DC | PRN
  Administered 2021-12-22: 200 mg via INTRAVENOUS

## 2021-12-22 MED ORDER — CHLORHEXIDINE GLUCONATE 0.12 % MT SOLN
OROMUCOSAL | Status: AC
  Administered 2021-12-22: 15 mL via OROMUCOSAL
  Filled 2021-12-22: qty 15

## 2021-12-22 MED ORDER — OXYCODONE HCL 5 MG PO TABS: 5.0000 mg | ORAL_TABLET | ORAL | 0 refills | Status: DC | PRN

## 2021-12-22 MED ORDER — KETOROLAC TROMETHAMINE 30 MG/ML IJ SOLN
INTRAMUSCULAR | Status: AC
  Administered 2021-12-22: 30 mg via INTRAVENOUS
  Filled 2021-12-22: qty 1

## 2021-12-22 MED ORDER — BUPIVACAINE HCL (PF) 0.5 % IJ SOLN
INTRAMUSCULAR | Status: DC | PRN
  Administered 2021-12-22: 20 mL via PERINEURAL

## 2021-12-22 MED ORDER — BUPIVACAINE HCL (PF) 0.5 % IJ SOLN
INTRAMUSCULAR | Status: AC
  Filled 2021-12-22: qty 20

## 2021-12-22 MED ORDER — LIDOCAINE HCL (PF) 1 % IJ SOLN
INTRAMUSCULAR | Status: DC | PRN
Start: 2021-12-22 — End: 2021-12-22
  Administered 2021-12-22: 3 mL via SUBCUTANEOUS

## 2021-12-22 MED ORDER — IPRATROPIUM-ALBUTEROL 0.5-2.5 (3) MG/3ML IN SOLN
3.0000 mL | Freq: Once | RESPIRATORY_TRACT | Status: AC
Start: 2021-12-22 — End: 2021-12-22

## 2021-12-22 MED ORDER — RINGERS IRRIGATION IR SOLN
Status: DC | PRN
  Administered 2021-12-22: 3000 mL

## 2021-12-22 MED ORDER — BUPIVACAINE LIPOSOME 1.3 % IJ SUSP
INTRAMUSCULAR | Status: AC
  Filled 2021-12-22: qty 10

## 2021-12-22 MED ORDER — BUPIVACAINE LIPOSOME 1.3 % IJ SUSP
INTRAMUSCULAR | Status: DC | PRN
  Administered 2021-12-22: 10 mL via PERINEURAL

## 2021-12-22 MED ORDER — IPRATROPIUM-ALBUTEROL 0.5-2.5 (3) MG/3ML IN SOLN
RESPIRATORY_TRACT | Status: AC
  Administered 2021-12-22: 3 mL via RESPIRATORY_TRACT
  Filled 2021-12-22: qty 3

## 2021-12-22 MED ORDER — MIDAZOLAM HCL 2 MG/2ML IJ SOLN
1.0000 mg | INTRAMUSCULAR | Status: AC | PRN
  Administered 2021-12-22: 1 mg via INTRAVENOUS

## 2021-12-22 MED ORDER — ROCURONIUM BROMIDE 100 MG/10ML IV SOLN
INTRAVENOUS | Status: DC | PRN
  Administered 2021-12-22: 60 mg via INTRAVENOUS
  Administered 2021-12-22: 30 mg via INTRAVENOUS

## 2021-12-22 MED ORDER — MIDAZOLAM HCL 2 MG/2ML IJ SOLN
INTRAMUSCULAR | Status: AC
  Filled 2021-12-22: qty 2

## 2021-12-22 MED ORDER — LIDOCAINE HCL (PF) 1 % IJ SOLN
INTRAMUSCULAR | Status: AC
  Filled 2021-12-22: qty 5

## 2021-12-22 MED ORDER — OXYCODONE HCL 5 MG PO TABS
5.0000 mg | ORAL_TABLET | Freq: Once | ORAL | Status: AC
  Administered 2021-12-22: 5 mg via ORAL

## 2021-12-22 MED ORDER — FENTANYL CITRATE (PF) 100 MCG/2ML IJ SOLN: 25.0000 ug | INTRAMUSCULAR | Status: DC | PRN

## 2021-12-22 MED ORDER — PHENYLEPHRINE HCL (PRESSORS) 10 MG/ML IV SOLN
INTRAVENOUS | Status: AC
  Filled 2021-12-22: qty 1

## 2021-12-22 MED ORDER — PHENYLEPHRINE HCL-NACL 20-0.9 MG/250ML-% IV SOLN
INTRAVENOUS | Status: AC
  Filled 2021-12-22: qty 250

## 2021-12-22 MED ORDER — ONDANSETRON HCL 4 MG/2ML IJ SOLN
INTRAMUSCULAR | Status: DC | PRN
  Administered 2021-12-22: 4 mg via INTRAVENOUS

## 2021-12-22 MED ORDER — OXYCODONE HCL 5 MG PO TABS
ORAL_TABLET | ORAL | Status: DC
Start: 2021-12-22 — End: 2021-12-22
  Filled 2021-12-22: qty 1

## 2021-12-22 MED ORDER — 0.9 % SODIUM CHLORIDE (POUR BTL) OPTIME
TOPICAL | Status: DC | PRN
  Administered 2021-12-22: 250 mL

## 2021-12-22 MED ORDER — CEFAZOLIN SODIUM-DEXTROSE 2-4 GM/100ML-% IV SOLN
INTRAVENOUS | Status: AC
  Filled 2021-12-22: qty 100

## 2021-12-22 MED ORDER — LIDOCAINE HCL (CARDIAC) PF 100 MG/5ML IV SOSY
PREFILLED_SYRINGE | INTRAVENOUS | Status: DC | PRN
  Administered 2021-12-22: 50 mg via INTRAVENOUS

## 2021-12-22 MED ORDER — DEXAMETHASONE SODIUM PHOSPHATE 10 MG/ML IJ SOLN
INTRAMUSCULAR | Status: DC | PRN
  Administered 2021-12-22: 10 mg via INTRAVENOUS

## 2021-12-22 MED ORDER — MIDAZOLAM HCL 2 MG/2ML IJ SOLN
INTRAMUSCULAR | Status: AC
  Administered 2021-12-22: 1 mg via INTRAVENOUS
  Filled 2021-12-22: qty 2

## 2021-12-22 MED ORDER — PHENYLEPHRINE 40 MCG/ML (10ML) SYRINGE FOR IV PUSH (FOR BLOOD PRESSURE SUPPORT)
PREFILLED_SYRINGE | INTRAVENOUS | Status: DC | PRN
  Administered 2021-12-22 (×6): 80 ug via INTRAVENOUS

## 2021-12-22 MED ORDER — PHENYLEPHRINE HCL-NACL 20-0.9 MG/250ML-% IV SOLN
INTRAVENOUS | Status: DC | PRN
  Administered 2021-12-22: 20 ug/min via INTRAVENOUS

## 2021-12-22 MED ORDER — KETOROLAC TROMETHAMINE 30 MG/ML IJ SOLN
30.0000 mg | Freq: Once | INTRAMUSCULAR | Status: AC
Start: 2021-12-22 — End: 2021-12-22

## 2021-12-22 MED ORDER — BUPIVACAINE-EPINEPHRINE (PF) 0.5% -1:200000 IJ SOLN
INTRAMUSCULAR | Status: AC
  Filled 2021-12-22: qty 30

## 2021-12-22 MED ORDER — ONDANSETRON HCL 4 MG/2ML IJ SOLN: 4.0000 mg | Freq: Once | INTRAMUSCULAR | Status: DC | PRN

## 2021-12-22 MED ORDER — BUPIVACAINE-EPINEPHRINE (PF) 0.5% -1:200000 IJ SOLN
INTRAMUSCULAR | Status: DC | PRN
  Administered 2021-12-22: 30 mL via PERINEURAL

## 2021-12-22 MED ORDER — PROPOFOL 10 MG/ML IV BOLUS
INTRAVENOUS | Status: AC
  Filled 2021-12-22: qty 20

## 2021-12-22 MED ORDER — EPINEPHRINE PF 1 MG/ML IJ SOLN
INTRAMUSCULAR | Status: DC | PRN
Start: 2021-12-22 — End: 2021-12-22
  Administered 2021-12-22: 1 mg

## 2021-12-22 MED ORDER — FENTANYL CITRATE (PF) 100 MCG/2ML IJ SOLN
INTRAMUSCULAR | Status: DC | PRN
  Administered 2021-12-22 (×2): 50 ug via INTRAVENOUS

## 2021-12-22 MED ORDER — PROPOFOL 10 MG/ML IV BOLUS
INTRAVENOUS | Status: DC | PRN
  Administered 2021-12-22: 150 mg via INTRAVENOUS
  Administered 2021-12-22: 30 mg via INTRAVENOUS

## 2021-12-22 SURGICAL SUPPLY — 50 items
ANCH SUT 2 JK 1.5X2.9 2 LD (Anchor) ×2 IMPLANT
ANCH SUT BN ASCP DLV (Anchor) ×2 IMPLANT
ANCH SUT RGNRT REGENETEN (Staple) ×2 IMPLANT
ANCHOR BONE REGENETEN (Anchor) ×1 IMPLANT
ANCHOR SUT JK SZ 2 2.9 DBL SL (Anchor) ×1 IMPLANT
ANCHOR TENDON REGENETEN (Staple) ×1 IMPLANT
APL PRP STRL LF DISP 70% ISPRP (MISCELLANEOUS) ×4
BIT DRILL JUGRKNT W/NDL BIT2.9 (DRILL) IMPLANT
BLADE FULL RADIUS 3.5 (BLADE) ×3 IMPLANT
BUR ACROMIONIZER 4.0 (BURR) ×3 IMPLANT
CANNULA SHAVER 8MMX76MM (CANNULA) ×3 IMPLANT
CHLORAPREP W/TINT 26 (MISCELLANEOUS) ×4 IMPLANT
COVER MAYO STAND REUSABLE (DRAPES) ×3 IMPLANT
DRILL JUGGERKNOT W/NDL BIT 2.9 (DRILL) ×3
ELECT REM PT RETURN 9FT ADLT (ELECTROSURGICAL) ×3
ELECTRODE REM PT RTRN 9FT ADLT (ELECTROSURGICAL) ×2 IMPLANT
GAUZE SPONGE 4X4 12PLY STRL (GAUZE/BANDAGES/DRESSINGS) ×3 IMPLANT
GAUZE XEROFORM 1X8 LF (GAUZE/BANDAGES/DRESSINGS) ×3 IMPLANT
GLOVE SRG 8 PF TXTR STRL LF DI (GLOVE) ×2 IMPLANT
GLOVE SURG ENC MOIS LTX SZ7.5 (GLOVE) ×6 IMPLANT
GLOVE SURG ENC MOIS LTX SZ8 (GLOVE) ×6 IMPLANT
GLOVE SURG UNDER LTX SZ8 (GLOVE) ×3 IMPLANT
GLOVE SURG UNDER POLY LF SZ8 (GLOVE) ×3
GOWN STRL REUS W/ TWL LRG LVL3 (GOWN DISPOSABLE) ×2 IMPLANT
GOWN STRL REUS W/ TWL XL LVL3 (GOWN DISPOSABLE) ×2 IMPLANT
GOWN STRL REUS W/TWL LRG LVL3 (GOWN DISPOSABLE) ×3
GOWN STRL REUS W/TWL XL LVL3 (GOWN DISPOSABLE) ×3
GRASPER SUT 15 45D LOW PRO (SUTURE) IMPLANT
IMPL REGENETEN MEDIUM (Shoulder) IMPLANT
IMPLANT REGENETEN MEDIUM (Shoulder) ×3 IMPLANT
IV LACTATED RINGER IRRG 3000ML (IV SOLUTION) ×3
IV LR IRRIG 3000ML ARTHROMATIC (IV SOLUTION) ×2 IMPLANT
MANIFOLD NEPTUNE II (INSTRUMENTS) ×5 IMPLANT
MASK FACE SPIDER DISP (MASK) ×3 IMPLANT
MAT ABSORB  FLUID 56X50 GRAY (MISCELLANEOUS) ×3
MAT ABSORB FLUID 56X50 GRAY (MISCELLANEOUS) ×2 IMPLANT
PACK ARTHROSCOPY SHOULDER (MISCELLANEOUS) ×3 IMPLANT
SLING ARM LRG DEEP (SOFTGOODS) ×3 IMPLANT
SLING ULTRA II LG (MISCELLANEOUS) ×3 IMPLANT
SPONGE T-LAP 18X18 ~~LOC~~+RFID (SPONGE) ×3 IMPLANT
STAPLER SKIN PROX 35W (STAPLE) ×3 IMPLANT
STRAP SAFETY 5IN WIDE (MISCELLANEOUS) ×3 IMPLANT
SUT ETHIBOND 0 MO6 C/R (SUTURE) ×3 IMPLANT
SUT VIC AB 2-0 CT1 27 (SUTURE) ×6
SUT VIC AB 2-0 CT1 TAPERPNT 27 (SUTURE) ×4 IMPLANT
TAPE MICROFOAM 4IN (TAPE) ×3 IMPLANT
TUBING CONNECTING 10 (TUBING) ×1 IMPLANT
TUBING INFLOW SET DBFLO PUMP (TUBING) ×3 IMPLANT
WAND WEREWOLF FLOW 90D (MISCELLANEOUS) ×3 IMPLANT
WATER STERILE IRR 500ML POUR (IV SOLUTION) ×3 IMPLANT

## 2021-12-22 NOTE — Anesthesia Preprocedure Evaluation (Signed)
Anesthesia Evaluation  ?Patient identified by MRN, date of birth, ID band ?Patient awake ? ? ? ?Reviewed: ?Allergy & Precautions, H&P , NPO status , Patient's Chart, lab work & pertinent test results, reviewed documented beta blocker date and time  ? ?Airway ?Mallampati: III ? ?TM Distance: >3 FB ?Neck ROM: Limited ? ? ? Dental ? ?(+) Teeth Intact ?  ?Pulmonary ?neg shortness of breath, asthma ,  ?  ?Pulmonary exam normal ? ? ? ? ? ? ? Cardiovascular ?Exercise Tolerance: Good ?Normal cardiovascular exam+ Valvular Problems/Murmurs  ?Rhythm:regular Rate:Normal ? ? ?  ?Neuro/Psych ?negative neurological ROS ? negative psych ROS  ? GI/Hepatic ?Neg liver ROS, GERD  Medicated,  ?Endo/Other  ?negative endocrine ROSdiabetes, Well Controlled ? Renal/GU ?negative Renal ROS  ?negative genitourinary ?  ?Musculoskeletal ? ? Abdominal ?  ?Peds ? Hematology ? ?(+) Blood dyscrasia, anemia ,   ?Anesthesia Other Findings ?Past Medical History: ?No date: Allergy ?No date: Anemia ?No date: Arthritis ?No date: Asthma ?No date: Diabetes mellitus without complication ([place]) ?No date: GERD (gastroesophageal reflux disease) ?No date: Heart murmur ?Past Surgical History: ?No date: CHOLECYSTECTOMY ?1999: NASAL SINUS SURGERY ?03/2006: NECK SURGERY ?2016: NECK SURGERY ?    Comment:  fusion T3-T2 ?No date: uterine cauterine cauterization ?    Comment:  2009 due to heavy menstrual bleeding ?BMI   ? Body Mass Index: 34.20 kg/m?  ?  ? Reproductive/Obstetrics ?negative OB ROS ? ?  ? ? ? ? ? ? ? ? ? ? ? ? ? ?  ?  ? ? ? ? ? ? ? ? ?Anesthesia Physical ?Anesthesia Plan ? ?ASA: 3 ? ?Anesthesia Plan: General ETT  ? ?Post-op Pain Management: Regional block*  ? ?Induction:  ? ?PONV Risk Score and Plan: 4 or greater ? ?Airway Management Planned:  ? ?Additional Equipment:  ? ?Intra-op Plan:  ? ?Post-operative Plan:  ? ?Informed Consent: I have reviewed the patients History and Physical, chart, labs and discussed the procedure  including the risks, benefits and alternatives for the proposed anesthesia with the patient or authorized representative who has indicated his/her understanding and acceptance.  ? ? ? ?Dental Advisory Given ? ?Plan Discussed with: CRNA ? ?Anesthesia Plan Comments: (Risks and benefits of isnb discussed and accepted by patient. ja)  ? ? ? ? ? ? ?Anesthesia Quick Evaluation ? ?

## 2021-12-22 NOTE — Discharge Instructions (Addendum)
Orthopedic discharge instructions: ?Keep dressing dry and intact.  ?May shower after dressing changed on post-op day #4 (Sunday).  ?Cover staples with Band-Aids after drying off. ?Apply ice frequently to shoulder. ?Take Aleve 2 tabs BID with meals for 7-10 days, then as necessary. ?Take oxycodone as prescribed when needed.  ?May supplement with ES Tylenol if necessary. ?Keep shoulder immobilizer on at all times except may remove for bathing purposes. ?Follow-up in 10-14 days or as scheduled. ? ?AMBULATORY SURGERY  ?DISCHARGE INSTRUCTIONS ? ? ?The drugs that you were given will stay in your system until tomorrow so for the next 24 hours you should not: ? ?Drive an automobile ?Make any legal decisions ?Drink any alcoholic beverage ? ? ?You may resume regular meals tomorrow.  Today it is better to start with liquids and gradually work up to solid foods. ? ?You may eat anything you prefer, but it is better to start with liquids, then soup and crackers, and gradually work up to solid foods. ? ? ?Please notify your doctor immediately if you have any unusual bleeding, trouble breathing, redness and pain at the surgery site, drainage, fever, or pain not relieved by medication. ? ? ? ?Additional Instructions: ? ?Please contact your physician with any problems or Same Day Surgery at [phone], Monday through Friday 6 am to 4 pm, or [place] at [facility] number at [phone].  ?

## 2021-12-22 NOTE — Op Note (Signed)
12/22/2021 ? ?2:00 PM ? ?Patient:   Darlene Rollins ? ?Pre-Op Diagnosis:   Impingement/tendinopathy with partial-thickness rotator cuff tear, biceps tendinitis, and possible calcific tendinitis, right shoulder. ? ?Post-Op Diagnosis:   Impingement/tendinopathy with partial-thickness rotator cuff tear, degenerative labral fraying, and biceps tendinopathy, right shoulder. ? ?Procedure:   Limited arthroscopic debridement, arthroscopic subacromial decompression, mini-open rotator cuff repair using a [employer]hew Regeneten patch, and mini-open biceps tenodesis, right shoulder. ? ?Anesthesia:   General endotracheal with interscalene block using Exparel placed preoperatively by the anesthesiologist. ? ?Surgeon:   Maryagnes Amos, MD ? ?Assistant:   Arsenio Loader, RNFA ? ?Findings:   As above. There was a partial-thickness tear involving the articular insertional fibers of the supraspinatus tendon, compromising approximately 15 to 20% of the footprint. The remainder of the rotator cuff was in satisfactory condition. There was moderate fraying of the labrum superiorly and anteriorly without frank detachment of the labrum from the glenoid rim. The biceps tendon demonstrated areas of lip sticking and tendinopathic changes without partial or full-thickness tearing. The articular surfaces of the glenoid and humerus both were in satisfactory condition. ? ?Complications:   None ? ?Fluids:   800 cc ? ?Estimated blood loss:   20 cc ? ?Tourniquet time:   None ? ?Drains:   None ? ?Closure:   Staples     ? ?Brief clinical note:   The patient is a 61 year old female with a history of progressively worsening right shoulder pain. The patient's symptoms have progressed despite medications, activity modification, etc. The patient's history and examination are consistent with impingement/tendinopathy with a possible rotator cuff tear. These findings were confirmed by MRI scan. The patient presents at this time for definitive management  of these shoulder symptoms. ? ?Procedure:   The patient underwent placement of an interscalene block using Exparel by the anesthesiologist in the preoperative holding area before being brought into the operating room and lain in the supine position. The patient then underwent general endotracheal intubation and anesthesia before being repositioned in the beach chair position using the beach chair positioner. The right shoulder and upper extremity were prepped with ChloraPrep solution before being draped sterilely. Preoperative antibiotics were administered. A timeout was performed to confirm the proper surgical site before the expected portal sites and incision site were injected with 0.5% Sensorcaine with epinephrine.  ? ?A posterior portal was created and the glenohumeral joint thoroughly inspected with the findings as described above. An anterior portal was created using an outside-in technique. The labrum and rotator cuff were further probed, again confirming the above-noted findings. The areas of labral fraying were debrided back to stable margins using the full-radius resector, as was the torn portion of the supraspinatus tendon articular fibers. The ArthroCare wand was inserted and used to release the biceps tendon from its labral anchor.  It also was used to obtain hemostasis as well as to "anneal" the labrum superiorly and anteriorly. The instruments were removed from the joint after suctioning the excess fluid. ? ?The camera was repositioned through the posterior portal into the subacromial space. A separate lateral portal was created using an outside-in technique. The 3.5 mm full-radius resector was introduced and used to perform a subtotal bursectomy. The ArthroCare wand was then inserted and used to remove the periosteal tissue off the undersurface of the anterior third of the acromion as well as to recess the coracoacromial ligament from its attachment along the anterior and lateral margins of the  acromion. The 4.0 mm acromionizing bur  was introduced and used to complete the decompression by removing the undersurface of the anterior third of the acromion. The full radius resector was reintroduced to remove any residual bony debris before the ArthroCare wand was reintroduced to obtain hemostasis. The instruments were then removed from the subacromial space after suctioning the excess fluid. ? ?An approximately 4-5 cm incision was made over the anterolateral aspect of the shoulder beginning at the anterolateral corner of the acromion and extending distally in line with the bicipital groove. This incision was carried down through the subcutaneous tissues to expose the deltoid fascia. The raphae between the anterior and middle thirds was identified and this plane developed to provide access into the subacromial space. Additional bursal tissues were debrided sharply using Metzenbaum scissors. The rotator cuff was carefully inspected. Although there was no evidence of bursal sided damage, there was some thinning of the supraspinatus in the area of the articular sided partial-thickness tear. Therefore, it was felt best to reinforce this area with a [employer] Nephew Regeneten patch. A medium patch was selected and applied over the anterior fibers of the supraspinatus tendon before being secured using appropriate bone and soft tissue staples. An apparent watertight closure was obtained. ? ?The bicipital groove was identified by palpation and opened for 1-1.5 cm. The biceps tendon stump was retrieved through this defect. The floor of the bicipital groove was roughened with a curet before a Biomet 2.9 mm JuggerKnot anchor was inserted. Both sets of sutures were passed through the biceps tendon and tied securely to effect the tenodesis. The bicipital sheath was reapproximated using two #0 Ethibond interrupted sutures, incorporating the biceps tendon to further reinforce the tenodesis. ? ?The wound was copiously irrigated  with sterile saline solution before the deltoid raphae was reapproximated using 2-0 Vicryl interrupted sutures. The subcutaneous tissues were closed in two layers using 2-0 Vicryl interrupted sutures before the skin was closed using staples. The portal sites also were closed using staples. A sterile bulky dressing was applied to the shoulder before the arm was placed into a shoulder immobilizer. The patient was then awakened, extubated, and returned to the recovery room in satisfactory condition after tolerating the procedure well. ?

## 2021-12-22 NOTE — Transfer of Care (Signed)
Immediate Anesthesia Transfer of Care Note ? ?Patient: Darlene Rollins ? ?Procedure(s) Performed: Shoulder arthroscopy with debridement, decompression,  rotator cuff repair,  biceps tenodesis.-RNFA (Right: Shoulder) ?BICEPS TENODESIS (Right) ? ?Patient Location: PACU ? ?Anesthesia Type:General ? ?Level of Consciousness: awake and drowsy ? ?Airway & Oxygen Therapy: Patient Spontanous Breathing and Patient connected to nasal cannula oxygen ? ?Post-op Assessment: Report given to RN and Post -op Vital signs reviewed and stable ? ?Post vital signs: Reviewed and stable ? ?Last Vitals:  ?Vitals Value Taken Time  ?BP 135/82 12/22/21 1245  ?Temp 36.4 ?C 12/22/21 1245  ?Pulse 91 12/22/21 1246  ?Resp 13 12/22/21 1246  ?SpO2 93 % 12/22/21 1246  ?Vitals shown include unvalidated device data. ? ?Last Pain:  ?Vitals:  ? 12/22/21 0940  ?TempSrc: Temporal  ?PainSc: 2   ?   ? ?  ? ?Complications: No notable events documented. ?

## 2021-12-22 NOTE — H&P (Signed)
History of Present Illness: ?Darlene Rollins is a 61 y.o. female who presents today for her surgical history and physical for upcoming right knee arthroscopy with debridement, decompression and possible rotator cuff repair and possible biceps tenodesis. Surgery scheduled with Dr. Joice Lofts on 12/23/2021. The patient denies any changes in her medical history since she was last evaluated. Pain score in the right shoulder is a 2 out of 10 at today's visit. The patient denies any falls or trauma affecting the right shoulder since her last evaluation. The patient denies any numbness or ting of the right upper extremity at this time as well. The patient denies any personal history of heart attack, stroke, COPD or blood clot. The patient does have a history of asthma, she does have a rescue inhaler that she uses sparingly. ? ?Past Medical History: ? Abnormal liver function (([facility]) mild - ultrasound with fatty liver and gallbladder polyp)  ? Allergic state  ? Anemia  ? Arthritis  ? Asthma without status asthmaticus, unspecified  ? Cervicalgia  ? 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 Diabetes mellitus type 2, uncomplicated (CMS-HCC)  ? Diverticulosis 07/18/14  ? Gastric polyps  ? Gastric polyps 07/18/14 (NEGATIVE)  ? Gastritis 07/18/14  ? GERD (gastroesophageal reflux disease)  ? GERD (gastroesophageal reflux disease) 07/18/14  ? Hemorrhoids  ? Hypertension  ? IBS (irritable bowel syndrome)  ? IBS (irritable bowel syndrome)  ? Leukocytosis (mild)  ? Lumbago  ? Obesity  ? Obesity  ? Osteoarthritis  ?[facility]) a. cervical spine b. lumbar spine c. left shoulder capsulitis  ? Vitamin D deficiency disease  ? ?Past Surgical History: ? FUNCTIONAL ENDOSCOPIC SINUS SURGERY 1999  ? neck surgery 2007 for two ruptured discs  ? SPINE SURGERY 2007  ? COLONOSCOPY 07/18/2014 (Adenomatous Polyps: [facility] 07/2017)  ? POSTERIOR CERVICLE SPINE FUSION MULTIPLE LEVELS N/A 01/09/2015  ?Procedure: AIRO #1, POSTERIOR CERVICLE SPINE FUSION MULTIPLE  LEVELS; Surgeon: Fulton Mole, MD; Location: [facility]; Service: Orthopedics  ? INSTRUMENTATION POSTERIOR SPINE 3 TO 6 VERTEBRAL SEGMENTS N/A 01/09/2015  ?Procedure: INSTRUMENTATION POSTERIOR SPINE 3 TO 6 VERTEBRAL SEGMENTS; Surgeon: Fulton Mole, MD; Location: [facility]; Service: Orthopedics;  ? AUTOGRAFT OBTAINED SAME INCISION FOR SPINE SURGERY N/A 01/09/2015  ?Procedure: AUTOGRAFT OBTAINED SAME INCISION FOR SPINE SURGERY; Surgeon: Fulton Mole, MD; Location: [facility]; Service: Orthopedics; Laterality: N/A;  ? LAMINECTOMY POSTERIOR CERVICLE DECOMP W/FACETECTOMY & FORAMINOTOMY N/A 01/09/2015  ?Procedure: LAMINECTOMY POSTERIOR CERVICLE DECOMP W/FACETECTOMY & FORAMINOTOMY; Surgeon: Dede Query, MD; Location: [facility]; Service: Neurosurgery ? COLONOSCOPY 05/02/2017 (Adenomatous Polyps: [facility] 04/2022)  ? EGD 05/02/2017 (Gastritis: No repeat per RTE)  ? CHOLECYSTECTOMY  ? COLONOSCOPY 07/08/2011, 08/05/2008, 02/27/2006, 03/22/2004, 03/25/2002, 04/13/2001,02/13/2001 (Adenomatous Polyps)  ? EGD 07/18/2014, 09/13/2013, 08/21/2012, 07/08/2011, 08/05/2008, 05/05/2003  ? FUNCTIONAL ENDOSCOPIC SINUS SURGERY  ? Gastric polyps 2004, 2007 (EGD)  ? ?Past Family History: ? High blood pressure (Hypertension) Mother  ? Lymphoma Mother  ? Obesity Mother  ? Skin cancer Mother (Also Sezary Syndrome)  ? High blood pressure (Hypertension) Father  ? Allergic rhinitis Father  ? Asthma Father  ? Hemochromatosis Father  ? Rheum arthritis Father  ? Reflux disease Father  ? Obesity Father  ? Colon cancer Maternal Grandmother  ? Obesity Sister  ? Obesity Brother  ? Prostate cancer Maternal Grandfather  ? Anesthesia problems Neg Hx  ? Malignant hypertension Neg Hx  ? ?Medications: ? acetaminophen (TYLENOL) 500 MG tablet Take 1,000 mg by mouth every 6 (six)  hours as needed for Pain  ? albuterol 90 mcg/actuation inhaler Inhale 2 inhalations into the lungs every 6 (six) hours as  needed for Wheezing. Reported on 01/14/2016 ? BACILLUS COAGULANS (PROBIOTIC, B. COAGULANS, ORAL) Take 1 tablet by mouth once daily.  ? BIOFREEZE, MENTHOL, TOP Apply topically.  ? buPROPion (WELLBUTRIN XL) 300 MG XL tablet Take 1 tablet by mouth once daily 90 tablet 3  ? calcium carbonate (TUMS E-X) 300 mg (750 mg) chewable tablet Take 300 mg of elemental by mouth every 2 (two) hours as needed for Heartburn  ? cyanocobalamin (VITAMIN B12) 1,000 mcg/mL injection INJECT 1 ML (CC) INTRAMUSCULARLY ONCE EVERY MONTH 3 mL 3  ? dulaglutide (TRULICITY) 0.75 mg/0.5 mL subcutaneous pen injector Inject 0.5 mLs (0.75 mg total) subcutaneously once a week 6 mL 1  ? ergocalciferol, vitamin D2, 1,250 mcg (50,000 unit) capsule Take 1 capsule by mouth once a week 12 capsule 3  ? glimepiride (AMARYL) 1 MG tablet TAKE 1 & 1/2 TABLETS BY MOUTH TWICE DAILY 270 tablet 3  ? hyoscyamine (LEVSIN/SL) 0.125 mg SL tablet Place 1 tablet (0.125 mg total) under the tongue every 6 (six) hours as needed for Cramping or Diarrhea. 30 tablet 1  ? JANUVIA 100 mg tablet TAKE 1 TABLET BY MOUTH EVERY DAY 90 tablet 2  ? loratadine/pseudoephedrine (CLARITIN-D 24 HOUR ORAL) Take by mouth  ? metFORMIN (GLUCOPHAGE) 1000 MG tablet Take 1 tablet (1,000 mg total) by mouth 2 (two) times daily with meals 180 tablet 3  ? montelukast (SINGULAIR) 10 mg tablet Take 10 mg by mouth nightly.  ? multivitamin tablet Take 1 tablet by mouth once daily.  ? naproxen (NAPROSYN) 250 MG tablet Take by mouth  ? polyethylene glycol (MIRALAX) packet Take 17 g by mouth continuously as needed Mix in 4-8ounces of fluid prior to taking.  ? RABEprazole (ACIPHEX) 20 mg EC tablet Take 1 tablet (20 mg total) by mouth 2 (two) times daily 30 min prior to meals. 180 tablet 3  ? simethicone (GAS-X EXTRA STRENGTH ORAL) Take by mouth once as needed  ? tamsulosin (FLOMAX) 0.4 mg capsule Take 0.4 mg by mouth once daily  ? triamterene-hydrochlorothiazide (MAXZIDE-25) 37.5-25 mg tablet Take 1 tablet by  mouth once daily 90 tablet 3  ? UNABLE TO FIND Take 1.5 mg by mouth once as needed Midnite Melatonin- 1.5mg , lemon balm, chamomile & lavender blend-22 mcg  ? Bacillus subtilis-inulin 1.5 billion cell-1 gram Chew Take by mouth (Patient not taking: Reported on 12/13/2021)  ? BENEFIBER, GUAR GUM, ORAL Take 1 tablet by mouth once daily (Patient not taking: Reported on 12/13/2021)  ? calcium carbonate (TUMS E-X) 300 mg (750 mg) chewable tablet Take by mouth (Patient not taking: Reported on 12/13/2021)  ? chlorphen-phenyleph-ibuprofen 4-10-200 mg Tab Take by mouth (Patient not taking: Reported on 12/13/2021)  ? ferrous sulfate 325 (65 FE) MG tablet Take 325 mg by mouth daily with breakfast (Patient not taking: Reported on 12/13/2021)  ? loratadine (CLARITIN) 10 mg tablet Take by mouth (Patient not taking: Reported on 12/13/2021)  ? oxybutynin (DITROPAN-XL) 10 MG XL tablet Take 10 mg by mouth once daily (Patient not taking: Reported on 12/13/2021)  ? pen needle, diabetic 31 gauge x 15/64" Ndle USE AS DIRECTED (Patient not taking: Reported on 12/13/2021) 100 each 5  ? plecanatide (TRULANCE) 3 mg tablet Take 1 tablet (3 mg total) by mouth once daily (Patient not taking: Reported on 12/13/2021) 90 tablet 1  ? solifenacin (VESICARE) 10 MG tablet Take 10  mg by mouth once daily  ? sucralfate (CARAFATE) 1 gram tablet Take 1 tablet (1 g total) by mouth 2 (two) times daily before meals (Patient not taking: Reported on 12/13/2021) 180 tablet 3  ? VICTOZA 3-PAK 0.6 mg/0.1 mL (18 mg/3 mL) pen injector INJECT 0.3MLS (1.8MG  TOTAL) SUBCUTANEOUSLY ONCE DAILY 27 mL 3  ? ?Allergies: ?No Known Allergies  ? ?Review of Systems:  ?A comprehensive 14 point ROS was performed, reviewed by me today, and the pertinent orthopaedic findings are documented in the HPI. ? ?Physical Exam: ?BP 124/86 (BP Location: Left upper arm, Patient Position: Sitting, BP Cuff Size: Adult)  Ht 157.5 cm (5\' 2" )  Wt 86.1 kg (189 lb 12.8 oz)  LMP 07/19/2008  BMI 34.71 kg/m?   ?General/Constitutional: The patient appears to be well-nourished, well-developed, and in no acute distress. ?Neuro/Psych: Normal mood and affect, oriented to person, place and time. ?Eyes: Non-icteric. Pupils are equal,

## 2021-12-22 NOTE — Anesthesia Procedure Notes (Signed)
Procedure Name: Intubation ?Date/Time: 12/22/2021 11:57 AM ?Performed by: Cheral Bay, CRNA ?Pre-anesthesia Checklist: Patient identified, Emergency Drugs available, Suction available and Patient being monitored ?Patient Re-evaluated:Patient Re-evaluated prior to induction ?Oxygen Delivery Method: Circle system utilized ?Preoxygenation: Pre-oxygenation with 100% oxygen ?Induction Type: IV induction ?Ventilation: Mask ventilation without difficulty ?Laryngoscope Size: McGraph and 3 ?Grade View: Grade II ?Tube type: Oral ?Tube size: 7.0 mm ?Number of attempts: 1 ?Airway Equipment and Method: Stylet ?Placement Confirmation: ETT inserted through vocal cords under direct vision, positive ETCO2 and breath sounds checked- equal and bilateral ?Secured at: 22 cm ?Tube secured with: Tape ?Dental Injury: Teeth and Oropharynx as per pre-operative assessment  ?Comments: Meticulous care maintaining neuutral neck for intubation and transfer ? ? ? ? ?

## 2021-12-22 NOTE — Anesthesia Procedure Notes (Signed)
Anesthesia Regional Block: Interscalene brachial plexus block   Pre-Anesthetic Checklist: , timeout performed,  Correct Patient, Correct Site, Correct Laterality,  Correct Procedure, Correct Position, site marked,  Risks and benefits discussed,  Surgical consent,  Pre-op evaluation,  At surgeon's request and post-op pain management  Laterality: Right  Prep: chloraprep       Needles:  Injection technique: Single-shot  Needle Type: Stimiplex     Needle Length: 10cm  Needle Gauge: 20     Additional Needles:   Procedures:, nerve stimulator,,, ultrasound used (permanent image in chart),,    Narrative:  Injection made incrementally with aspirations every 5 mL.  Performed by: Personally   Additional Notes: Functioning IV was confirmed and monitors were applied.Sterile prep and drape,hand hygiene and sterile gloves were used.  Negative aspiration and negative test dose prior to incremental administration of local anesthetic. The patient tolerated the procedure well.      

## 2021-12-23 ENCOUNTER — Encounter: Payer: Self-pay | Admitting: Surgery

## 2021-12-29 NOTE — Anesthesia Postprocedure Evaluation (Signed)
Anesthesia Post Note ? ?Patient: Darlene Rollins ? ?Procedure(s) Performed: Shoulder arthroscopy with debridement, decompression,  rotator cuff repair,  biceps tenodesis.-RNFA (Right: Shoulder) ?BICEPS TENODESIS (Right) ? ?Patient location during evaluation: PACU ?Anesthesia Type: General ?Level of consciousness: awake and alert ?Pain management: pain level controlled ?Vital Signs Assessment: post-procedure vital signs reviewed and stable ?Respiratory status: spontaneous breathing, nonlabored ventilation, respiratory function stable and patient connected to nasal cannula oxygen ?Cardiovascular status: blood pressure returned to baseline and stable ?Postop Assessment: no apparent nausea or vomiting ?Anesthetic complications: no ? ? ?No notable events documented. ? ? ?Last Vitals:  ?Vitals:  ? 12/22/21 1405 12/22/21 1420  ?BP:  132/67  ?Pulse: 83 89  ?Resp: 19 16  ?Temp:  (!) 36.2 ?C  ?SpO2: 92% 94%  ?  ?Last Pain:  ?Vitals:  ? 12/22/21 1424  ?TempSrc:   ?PainSc: 4   ? ? ?  ?  ?  ?  ?  ?  ? ?Yevette Edwards ? ? ? ? ?

## 2022-06-16 ENCOUNTER — Other Ambulatory Visit: Payer: Self-pay | Admitting: Family Medicine

## 2022-06-16 DIAGNOSIS — Z1231 Encounter for screening mammogram for malignant neoplasm of breast: Secondary | ICD-10-CM

## 2022-07-06 ENCOUNTER — Ambulatory Visit
Admission: RE | Admit: 2022-07-06 | Discharge: 2022-07-06 | Disposition: A | Discharge status: Home / Self Care | Payer: BC Managed Care – PPO | Source: Ambulatory Visit | Attending: Family Medicine | Admitting: Family Medicine

## 2022-07-06 DIAGNOSIS — Z1231 Encounter for screening mammogram for malignant neoplasm of breast: Secondary | ICD-10-CM | POA: Insufficient documentation

## 2022-10-07 ENCOUNTER — Other Ambulatory Visit
Admission: RE | Admit: 2022-10-07 | Discharge: 2022-10-07 | Disposition: A | Discharge status: Home / Self Care | Payer: BC Managed Care – PPO | Source: Ambulatory Visit | Attending: Student | Admitting: Student

## 2022-10-07 DIAGNOSIS — R0602 Shortness of breath: Secondary | ICD-10-CM | POA: Diagnosis present

## 2022-10-07 LAB — TROPONIN I (HIGH SENSITIVITY): Troponin I (High Sensitivity): 5 ng/L (ref <18)

## 2022-10-07 LAB — D-DIMER, QUANTITATIVE: D-Dimer, Quant: 0.32 ug/mL-FEU (ref 0.00–0.50)

## 2022-11-03 ENCOUNTER — Other Ambulatory Visit
Admission: RE | Admit: 2022-11-03 | Discharge: 2022-11-03 | Disposition: A | Discharge status: Home / Self Care | Payer: BC Managed Care – PPO | Source: Ambulatory Visit | Attending: Student | Admitting: Student

## 2022-11-03 DIAGNOSIS — R0602 Shortness of breath: Secondary | ICD-10-CM | POA: Insufficient documentation

## 2022-11-03 DIAGNOSIS — R0789 Other chest pain: Secondary | ICD-10-CM | POA: Diagnosis present

## 2022-11-03 LAB — TROPONIN I (HIGH SENSITIVITY): Troponin I (High Sensitivity): 4 ng/L (ref <18)

## 2022-11-03 LAB — BRAIN NATRIURETIC PEPTIDE: B Natriuretic Peptide: 9.7 pg/mL (ref 0.0–100.0)

## 2022-12-05 ENCOUNTER — Ambulatory Visit: Payer: BC Managed Care – PPO | Admitting: Urology

## 2022-12-05 ENCOUNTER — Encounter: Payer: Self-pay | Admitting: Urology

## 2022-12-05 VITALS — BP 144/86 | HR 86 | Ht 62.5 in | Wt 190.0 lb

## 2022-12-05 DIAGNOSIS — R3912 Poor urinary stream: Secondary | ICD-10-CM | POA: Diagnosis not present

## 2022-12-05 MED ORDER — TAMSULOSIN HCL 0.4 MG PO CAPS: 0.4000 mg | ORAL_CAPSULE | Freq: Every day | ORAL | 3 refills | Status: DC

## 2022-12-05 NOTE — Progress Notes (Signed)
12/05/2022 10:53 AM   Darlene Rollins [birth year] [id number]  Referring provider: Maryland Pink, MD [address] [facility] [place],  [place] [address]  Chief Complaint  Patient presents with   Follow-up    HPI: Reviewed note.  1 year ago patient dramatically better on Flomax and physical therapy getting up once or twice a night.  90 x 3 sent to pharmacy  \Today Flow much better on physical therapy and Flomax.  Since using CPAP gets up 0-1 time a night and is very happy.  No infections     PMH: Past Medical History:  Diagnosis Date   Allergy    Anemia    Arthritis    Asthma    Diabetes mellitus without complication (HCC)    GERD (gastroesophageal reflux disease)    Heart murmur     Surgical History: Past Surgical History:  Procedure Laterality Date   BICEPT TENODESIS Right 12/22/2021   Procedure: BICEPS TENODESIS;  Surgeon: Corky Mull, MD;  Location: [facility] ORS;  Service: Orthopedics;  Laterality: Right;   CHOLECYSTECTOMY     NASAL SINUS SURGERY  1999   NECK SURGERY  03/2006   NECK SURGERY  2016   fusion T3-T2   SHOULDER ARTHROSCOPY Right 12/22/2021   Procedure: Shoulder arthroscopy with debridement, decompression,  rotator cuff repair,  biceps tenodesis.-RNFA;  Surgeon: Corky Mull, MD;  Location: [facility] ORS;  Service: Orthopedics;  Laterality: Right;   uterine cauterine cauterization     2009 due to heavy menstrual bleeding    Home Medications:  Allergies as of 12/05/2022   No Active Allergies      Medication List        Accurate as of December 05, 2022 10:53 AM. If you have any questions, ask your nurse or doctor.          acetaminophen 500 MG tablet Commonly known as: TYLENOL Take 1,000 mg by mouth every 8 (eight) hours as needed for moderate pain.   Advil Allergy & Congestion 4-10-200 MG Tabs Generic drug: Chlorphen-Phenyleph-Ibuprofen Take 1 tablet by mouth daily as needed (congestion).   albuterol 108 (90 Base) MCG/ACT  inhaler Commonly known as: VENTOLIN HFA Inhale 2 puffs into the lungs every 4 (four) hours as needed for wheezing or shortness of breath.   Aleve PM 220-25 MG Tabs Generic drug: Naproxen Sod-diphenhydrAMINE Take 2 tablets by mouth at bedtime as needed (pain/sleep).   Biofreeze 4 % Gel Generic drug: Menthol (Topical Analgesic) Apply 1 application topically daily as needed (pain).   buPROPion 300 MG 24 hr tablet Commonly known as: WELLBUTRIN XL Take 300 mg by mouth daily.   calcium carbonate 750 MG chewable tablet Commonly known as: TUMS EX Chew 1 tablet by mouth daily as needed for heartburn.   cyanocobalamin 1000 MCG/ML injection Commonly known as: VITAMIN B12 Inject 1,000 mcg into the muscle every 30 (thirty) days.   Digestive Adv Multi-Strain Ult Chew Chew 1 capsule by mouth daily.   ferrous sulfate 325 (65 FE) MG tablet Take 325 mg by mouth daily with breakfast.   glimepiride 1 MG tablet Commonly known as: AMARYL Take 1 mg by mouth daily.   hyoscyamine 0.125 MG SL tablet Commonly known as: LEVSIN SL Place 0.125 mg under the tongue every 4 (four) hours as needed for cramping.   loratadine 10 MG tablet Commonly known as: CLARITIN Take 10 mg by mouth daily as needed for allergies.   metFORMIN 1000 MG tablet Commonly known as: GLUCOPHAGE Take  1,000 mg by mouth 2 (two) times daily with a meal.   montelukast 10 MG tablet Commonly known as: SINGULAIR Take 10 mg by mouth at bedtime.   multivitamin capsule Take 1 capsule by mouth daily.   oxyCODONE 5 MG immediate release tablet Commonly known as: Roxicodone Take 1-2 tablets (5-10 mg total) by mouth every 4 (four) hours as needed for moderate pain or severe pain.   RABEprazole 20 MG tablet Commonly known as: ACIPHEX Take 20 mg by mouth 2 (two) times daily.   ReliOn Pen Needles 31G X 6 MM Misc Generic drug: Insulin Pen Needle See admin instructions.   sitaGLIPtin 100 MG tablet Commonly known as: JANUVIA Take  100 mg by mouth daily.   tamsulosin 0.4 MG Caps capsule Commonly known as: FLOMAX Take 1 capsule (0.4 mg total) by mouth daily.   triamterene-hydrochlorothiazide 37.5-25 MG tablet Commonly known as: MAXZIDE-25 Take 1 tablet by mouth daily.   Trulance 3 MG Tabs Generic drug: Plecanatide Take 3 mg by mouth daily.   Trulicity [id number] Sopn Generic drug: Dulaglutide Inject 0.75 mg into the skin every Sunday.   Vitamin D (Ergocalciferol) 1.25 MG (50000 UNIT) Caps capsule Commonly known as: DRISDOL Take 50,000 Units by mouth every Saturday.        Allergies: No Active Allergies  Family History: Family History  Problem Relation Age of Onset   Cancer Mother    Cancer Maternal Grandmother    Cancer Maternal Grandfather    Cancer Paternal Grandmother    Breast cancer Neg Hx     Social History:  reports that she has never smoked. She has never used smokeless tobacco. She reports that she does not currently use alcohol. She reports that she does not use drugs.  ROS:                                        Physical Exam: LMP 07/27/2008 (Approximate) Comment: LMP Oct 2009  Constitutional:  Alert and oriented, No acute distress. HEENT: [place] AT, moist mucus membranes.  Trachea midline, no masses.   Laboratory Data: Lab Results  Component Value Date   WBC 10.3 08/06/2008   HGB 12.5 08/06/2008   HCT 38.2 08/06/2008   MCV 85.0 08/06/2008   PLT 505 (H) 08/06/2008    Lab Results  Component Value Date   CREATININE 0.85 08/06/2008    No results found for: "PSA"  No results found for: "TESTOSTERONE"  No results found for: "HGBA1C"  Urinalysis    Component Value Date/Time   APPEARANCEUR Hazy (A) 11/29/2021 1310   GLUCOSEU Negative 11/29/2021 1310   BILIRUBINUR Negative 11/29/2021 1310   PROTEINUR Negative 11/29/2021 1310   NITRITE Negative 11/29/2021 1310   LEUKOCYTESUR 2+ (A) 11/29/2021 1310    Pertinent Imaging:   Assessment &  Plan: Flomax 90 x 3 sent to pharmacy and I will see her in 1 year  1. Weak urinary stream  - Urinalysis, Complete   No follow-ups on file.  Reece Packer, MD  [facility] [address], [place], [place] [address] [phone]

## 2022-12-05 NOTE — Addendum Note (Signed)
Addended by: Kris Mouton on: 12/05/2022 03:54 PM   Modules accepted: Orders

## 2023-01-18 ENCOUNTER — Encounter: Payer: Self-pay | Admitting: Student in an Organized Health Care Education/Training Program

## 2023-01-18 ENCOUNTER — Ambulatory Visit: Payer: BC Managed Care – PPO | Admitting: Student in an Organized Health Care Education/Training Program

## 2023-01-18 VITALS — BP 124/78 | HR 92 | Temp 97.7°F | Ht 62.5 in | Wt 193.8 lb

## 2023-01-18 DIAGNOSIS — J4530 Mild persistent asthma, uncomplicated: Secondary | ICD-10-CM | POA: Diagnosis not present

## 2023-01-18 MED ORDER — BUDESONIDE-FORMOTEROL FUMARATE 80-4.5 MCG/ACT IN AERO: 2.0000 | INHALATION_SPRAY | RESPIRATORY_TRACT | 12 refills | Status: DC | PRN

## 2023-01-18 NOTE — Progress Notes (Signed)
Synopsis: Referred in for cough by Jerl MinaHedrick, James, MD  Assessment & Plan:   1. Mild persistent asthma without complication  Patient presenting to follow up on mild persistent asthma/cough variant asthma, currently only using short acting beta agonist for rescue.  I have discussed with the patient different modes of control including standing controller inhalers vs following GINA with as needed ICS/LABA after contorl of symptoms. Given she is reluctant to use a standing inhaler, I have proposed to her using Symbicort 80-4.52 inhalations every 4 hours as needed to deliver corticosteroids per GINA recommendations.  Patient will use this and call us back should symptoms not improve.  - budesonide-formoterol (SYMBICORT) 80-4.5 MCG/ACT inhaler; Inhale 2 puffs into the lungs every 4 (four) hours as needed.  Dispense: 1 each; Refill: 12   Return in about 6 months (around 07/20/2023).  I spent 45 minutes caring for this patient today, including preparing to see the patient, obtaining a medical history , reviewing a separately obtained history, performing a medically appropriate examination and/or evaluation, counseling and educating the patient/family/caregiver, ordering medications, tests, or procedures, documenting clinical information in the electronic health record, and independently interpreting results (not separately reported/billed) and communicating results to the patient/family/caregiver  Raechel ChuteKhabib Ianna Salmela, MD [facility]   End of visit medications:  Meds ordered this encounter  Medications   budesonide-formoterol (SYMBICORT) 80-4.5 MCG/ACT inhaler    Sig: Inhale 2 puffs into the lungs every 4 (four) hours as needed.    Dispense:  1 each    Refill:  12     Current Outpatient Medications:    acetaminophen (TYLENOL) 500 MG tablet, Take 1,000 mg by mouth every 8 (eight) hours as needed for moderate pain., Disp: , Rfl:    albuterol (VENTOLIN HFA) 108 (90 Base) MCG/ACT  inhaler, Inhale 2 puffs into the lungs every 4 (four) hours as needed for wheezing or shortness of breath., Disp: , Rfl:    budesonide-formoterol (SYMBICORT) 80-4.5 MCG/ACT inhaler, Inhale 2 puffs into the lungs every 4 (four) hours as needed., Disp: 1 each, Rfl: 12   buPROPion (WELLBUTRIN XL) 300 MG 24 hr tablet, Take 300 mg by mouth daily., Disp: , Rfl:    calcium carbonate (TUMS EX) 750 MG chewable tablet, Chew 1 tablet by mouth daily as needed for heartburn., Disp: , Rfl:    cyanocobalamin (,VITAMIN B-12,) 1000 MCG/ML injection, Inject 1,000 mcg into the muscle every 30 (thirty) days., Disp: , Rfl:    ferrous sulfate 325 (65 FE) MG tablet, Take 325 mg by mouth daily with breakfast., Disp: , Rfl:    hyoscyamine (LEVSIN SL) 0.125 MG SL tablet, Place 0.125 mg under the tongue every 4 (four) hours as needed for cramping., Disp: , Rfl:    loratadine (CLARITIN) 10 MG tablet, Take 10 mg by mouth daily as needed for allergies., Disp: , Rfl:    Menthol, Topical Analgesic, (BIOFREEZE) 4 % GEL, Apply 1 application topically daily as needed (pain)., Disp: , Rfl:    metFORMIN (GLUCOPHAGE) 1000 MG tablet, Take 1,000 mg by mouth 2 (two) times daily with a meal., Disp: , Rfl:    montelukast (SINGULAIR) 10 MG tablet, Take 10 mg by mouth at bedtime., Disp: , Rfl:    Multiple Vitamin (MULTIVITAMIN) capsule, Take 1 capsule by mouth daily., Disp: , Rfl:    Naproxen Sod-diphenhydrAMINE (ALEVE PM) 220-25 MG TABS, Take 2 tablets by mouth at bedtime as needed (pain/sleep)., Disp: , Rfl:    Probiotic Product (DIGESTIVE ADV MULTI-STRAIN ULT)  CHEW, Chew 1 capsule by mouth daily., Disp: , Rfl:    RABEprazole (ACIPHEX) 20 MG tablet, Take 20 mg by mouth 2 (two) times daily., Disp: , Rfl:    RELION PEN NEEDLES 31G X 6 MM MISC, See admin instructions., Disp: , Rfl:    sitaGLIPtin (JANUVIA) 100 MG tablet, Take 100 mg by mouth daily., Disp: , Rfl:    tamsulosin (FLOMAX) 0.4 MG CAPS capsule, Take 1 capsule (0.4 mg total) by mouth  daily., Disp: 90 capsule, Rfl: 3   triamterene-hydrochlorothiazide (MAXZIDE-25) 37.5-25 MG tablet, Take 1 tablet by mouth daily., Disp: , Rfl:    TRULANCE 3 MG TABS, Take 3 mg by mouth daily., Disp: , Rfl:    Vitamin D, Ergocalciferol, (DRISDOL) 1.25 MG (50000 UNIT) CAPS capsule, Take 50,000 Units by mouth every Saturday., Disp: , Rfl:    Subjective:   PATIENT ID: Darlene Rollins GENDER: female DOB: [birth year], MRN: [id number]  Chief Complaint  Patient presents with   pulmonary consult    Dx with asthma 1997-URI 12/23. Lingering sx 3 rounds of abx and prednisone.  Dry cough and wheezing    HPI  62 year old female past medical history of mild persistent asthma presenting for evaluation of cough.  Patient was previously seen in our clinic by Dr. Darrol Angel in 2019 for the evaluation of asthma.  Patient was previously on Advair, taking it twice daily, but stopped it after her asthma was well-controlled and her sugars were out of control.  Patient's reporting that her asthma was very well-controlled up until she contracted a URTI around Christmas time.  After this, she required multiple rounds of prednisone without much sustained improvement.  The symptoms improve briefly but then they recur.  She mostly complains of a cough that resolves with the use of her Ventolin.  She is using her Ventolin 2-3 times a day.  She has a history of reflux disease previously seen by GI for which she is maintained on PPI and dietary modification.  She was also previously seen by ENT at [facility] and 2019 where her vocal cords were evaluated and noted to be somewhat edematous, thought to be due to reflux disease.  Patient reports having a dog but this does not worsen her symptoms.  She feels that her symptoms get worse after she eats as well as at night.  Patient is a lifelong non-smoker and denies any exposures.  Ancillary information including prior medications, full medical/surgical/family/social histories, and PFTs (when  available) are listed below and have been reviewed.   Review of Systems  Constitutional:  Negative for chills and fever.  Respiratory:  Positive for cough, shortness of breath and wheezing.   Cardiovascular:  Negative for chest pain.     Objective:   Vitals:   01/18/23 0826  BP: 124/78  Pulse: 92  Temp: 97.7 F (36.5 C)  TempSrc: Temporal  SpO2: 98%  Weight: 193 lb 12.8 oz (87.9 kg)  Height: 5' 2.5" (1.588 m)   98% on RA BMI Readings from Last 3 Encounters:  01/18/23 34.88 kg/m  12/05/22 34.20 kg/m  12/22/21 34.20 kg/m   Wt Readings from Last 3 Encounters:  01/18/23 193 lb 12.8 oz (87.9 kg)  12/05/22 190 lb (86.2 kg)  12/22/21 190 lb (86.2 kg)    Physical Exam Constitutional:      General: She is not in acute distress.    Appearance: She is not ill-appearing.  HENT:     Mouth/Throat:     Mouth: Mucous membranes are  moist.  Cardiovascular:     Rate and Rhythm: Normal rate and regular rhythm.     Pulses: Normal pulses.     Heart sounds: Normal heart sounds.  Pulmonary:     Effort: Pulmonary effort is normal.     Breath sounds: Normal breath sounds. No wheezing or rales.  Abdominal:     Palpations: Abdomen is soft.  Neurological:     General: No focal deficit present.     Mental Status: She is alert and oriented to person, place, and time. Mental status is at baseline.       Ancillary Information    Past Medical History:  Diagnosis Date   Allergy    Anemia    Arthritis    Asthma    Diabetes mellitus without complication    GERD (gastroesophageal reflux disease)    Heart murmur      Family History  Problem Relation Age of Onset   Cancer Mother    Cancer Maternal Grandmother    Cancer Maternal Grandfather    Cancer Paternal Grandmother    Breast cancer Neg Hx      Past Surgical History:  Procedure Laterality Date   BICEPT TENODESIS Right 12/22/2021   Procedure: BICEPS TENODESIS;  Surgeon: Christena Flake, MD;  Location: [facility] ORS;   Service: Orthopedics;  Laterality: Right;   CHOLECYSTECTOMY     NASAL SINUS SURGERY  1999   NECK SURGERY  03/2006   NECK SURGERY  2016   fusion T3-T2   SHOULDER ARTHROSCOPY Right 12/22/2021   Procedure: Shoulder arthroscopy with debridement, decompression,  rotator cuff repair,  biceps tenodesis.-RNFA;  Surgeon: Christena Flake, MD;  Location: [facility] ORS;  Service: Orthopedics;  Laterality: Right;   uterine cauterine cauterization     2009 due to heavy menstrual bleeding    Social History   Socioeconomic History   Marital status: Single    Spouse name: Not on file   Number of children: Not on file   Years of education: Not on file   Highest education level: Not on file  Occupational History   Not on file  Tobacco Use   Smoking status: Never    Passive exposure: Never   Smokeless tobacco: Never  Vaping Use   Vaping Use: Never used  Substance and Sexual Activity   Alcohol use: Not Currently   Drug use: Never   Sexual activity: Not Currently  Other Topics Concern   Not on file  Social History Narrative   Not on file   Social Determinants of Health   Financial Resource Strain: Not on file  Food Insecurity: Not on file  Transportation Needs: Not on file  Physical Activity: Not on file  Stress: Not on file  Social Connections: Not on file  Intimate Partner Violence: Not on file     No Known Allergies   CBC    Component Value Date/Time   WBC 10.3 08/06/2008 1420   RBC 4.49 08/06/2008 1420   HGB 12.5 08/06/2008 1420   HCT 38.2 08/06/2008 1420   PLT 505 (H) 08/06/2008 1420   MCV 85.0 08/06/2008 1420   MCHC 32.8 08/06/2008 1420   RDW 14.5 08/06/2008 1420    Pulmonary Functions Testing Results:     No data to display          Outpatient Medications Prior to Visit  Medication Sig Dispense Refill   acetaminophen (TYLENOL) 500 MG tablet Take 1,000 mg by mouth every 8 (eight) hours as needed for  moderate pain.     albuterol (VENTOLIN HFA) 108 (90 Base) MCG/ACT  inhaler Inhale 2 puffs into the lungs every 4 (four) hours as needed for wheezing or shortness of breath.     buPROPion (WELLBUTRIN XL) 300 MG 24 hr tablet Take 300 mg by mouth daily.     calcium carbonate (TUMS EX) 750 MG chewable tablet Chew 1 tablet by mouth daily as needed for heartburn.     cyanocobalamin (,VITAMIN B-12,) 1000 MCG/ML injection Inject 1,000 mcg into the muscle every 30 (thirty) days.     ferrous sulfate 325 (65 FE) MG tablet Take 325 mg by mouth daily with breakfast.     hyoscyamine (LEVSIN SL) 0.125 MG SL tablet Place 0.125 mg under the tongue every 4 (four) hours as needed for cramping.     loratadine (CLARITIN) 10 MG tablet Take 10 mg by mouth daily as needed for allergies.     Menthol, Topical Analgesic, (BIOFREEZE) 4 % GEL Apply 1 application topically daily as needed (pain).     metFORMIN (GLUCOPHAGE) 1000 MG tablet Take 1,000 mg by mouth 2 (two) times daily with a meal.     montelukast (SINGULAIR) 10 MG tablet Take 10 mg by mouth at bedtime.     Multiple Vitamin (MULTIVITAMIN) capsule Take 1 capsule by mouth daily.     Naproxen Sod-diphenhydrAMINE (ALEVE PM) 220-25 MG TABS Take 2 tablets by mouth at bedtime as needed (pain/sleep).     Probiotic Product (DIGESTIVE ADV MULTI-STRAIN ULT) CHEW Chew 1 capsule by mouth daily.     RABEprazole (ACIPHEX) 20 MG tablet Take 20 mg by mouth 2 (two) times daily.     RELION PEN NEEDLES 31G X 6 MM MISC See admin instructions.     sitaGLIPtin (JANUVIA) 100 MG tablet Take 100 mg by mouth daily.     tamsulosin (FLOMAX) 0.4 MG CAPS capsule Take 1 capsule (0.4 mg total) by mouth daily. 90 capsule 3   triamterene-hydrochlorothiazide (MAXZIDE-25) 37.5-25 MG tablet Take 1 tablet by mouth daily.     TRULANCE 3 MG TABS Take 3 mg by mouth daily.     Vitamin D, Ergocalciferol, (DRISDOL) 1.25 MG (50000 UNIT) CAPS capsule Take 50,000 Units by mouth every Saturday.     Dulaglutide 4.5 MG/0.5ML SOPN Inject into the skin.     glimepiride (AMARYL) 1  MG tablet Take 1 mg by mouth daily.      No facility-administered medications prior to visit.

## 2023-07-25 ENCOUNTER — Encounter: Payer: Self-pay | Admitting: Student in an Organized Health Care Education/Training Program

## 2023-07-25 ENCOUNTER — Ambulatory Visit: Payer: BC Managed Care – PPO | Admitting: Student in an Organized Health Care Education/Training Program

## 2023-07-25 VITALS — BP 98/72 | HR 92 | Temp 97.8°F | Ht 62.5 in | Wt 171.8 lb

## 2023-07-25 DIAGNOSIS — Z23 Encounter for immunization: Secondary | ICD-10-CM

## 2023-07-25 DIAGNOSIS — J4530 Mild persistent asthma, uncomplicated: Secondary | ICD-10-CM

## 2023-07-25 NOTE — Progress Notes (Signed)
Assessment & Plan:   1. Mild persistent asthma without complication  Patient is presenting for follow up of mild persistent asthma, cough variant. She is currently compliant with Symbicort that she's used as a controller and rescue inhaler per GINA recommendations. She is feeling well with it and we will continue this. I did counsel her that with improvement in symptoms we could switch to using it as needed every 4 hours. I will obtain PFT's prior to follow up to assess spirometry. Patient will also get her Prevnar-20 vaccine today in clinic.  - Continue Symbicort - Prevnar-20 in clinic today. - Pulmonary Function Test [facility] Only; Future   Return in about 6 months (around 01/23/2024).  I spent 30 minutes caring for this patient today, including preparing to see the patient, obtaining a medical history , reviewing a separately obtained history, performing a medically appropriate examination and/or evaluation, counseling and educating the patient/family/caregiver, ordering medications, tests, or procedures, and documenting clinical information in the electronic health record  Raechel Chute, MD  Pulmonary Critical Care   End of visit medications:  No orders of the defined types were placed in this encounter.    Current Outpatient Medications:    acetaminophen (TYLENOL) 500 MG tablet, Take 1,000 mg by mouth every 8 (eight) hours as needed for moderate pain., Disp: , Rfl:    albuterol (VENTOLIN HFA) 108 (90 Base) MCG/ACT inhaler, Inhale 2 puffs into the lungs every 4 (four) hours as needed for wheezing or shortness of breath., Disp: , Rfl:    budesonide-formoterol (SYMBICORT) 80-4.5 MCG/ACT inhaler, Inhale 2 puffs into the lungs every 4 (four) hours as needed., Disp: 1 each, Rfl: 12   buPROPion (WELLBUTRIN XL) 300 MG 24 hr tablet, Take 300 mg by mouth daily., Disp: , Rfl:    calcium carbonate (TUMS EX) 750 MG chewable tablet, Chew 1 tablet by mouth daily as needed for heartburn.,  Disp: , Rfl:    cyanocobalamin (,VITAMIN B-12,) 1000 MCG/ML injection, Inject 1,000 mcg into the muscle every 30 (thirty) days., Disp: , Rfl:    FARXIGA 10 MG TABS tablet, Take 10 mg by mouth daily., Disp: , Rfl:    ferrous sulfate 325 (65 FE) MG tablet, Take 325 mg by mouth daily with breakfast., Disp: , Rfl:    hyoscyamine (LEVSIN SL) 0.125 MG SL tablet, Place 0.125 mg under the tongue every 4 (four) hours as needed for cramping., Disp: , Rfl:    loratadine (CLARITIN) 10 MG tablet, Take 10 mg by mouth daily as needed for allergies., Disp: , Rfl:    Menthol, Topical Analgesic, (BIOFREEZE) 4 % GEL, Apply 1 application topically daily as needed (pain)., Disp: , Rfl:    metFORMIN (GLUCOPHAGE) 1000 MG tablet, Take 1,000 mg by mouth 2 (two) times daily with a meal., Disp: , Rfl:    montelukast (SINGULAIR) 10 MG tablet, Take 10 mg by mouth at bedtime., Disp: , Rfl:    MOUNJARO 10 MG/0.5ML Pen, Inject 10 mg into the skin once a week., Disp: , Rfl:    Naproxen Sod-diphenhydrAMINE (ALEVE PM) 220-25 MG TABS, Take 2 tablets by mouth at bedtime as needed (pain/sleep)., Disp: , Rfl:    Probiotic Product (DIGESTIVE ADV MULTI-STRAIN ULT) CHEW, Chew 1 capsule by mouth daily., Disp: , Rfl:    RABEprazole (ACIPHEX) 20 MG tablet, Take 20 mg by mouth 2 (two) times daily., Disp: , Rfl:    sitaGLIPtin (JANUVIA) 100 MG tablet, Take 100 mg by mouth daily., Disp: , Rfl:  tamsulosin (FLOMAX) 0.4 MG CAPS capsule, Take 1 capsule (0.4 mg total) by mouth daily., Disp: 90 capsule, Rfl: 3   triamterene-hydrochlorothiazide (MAXZIDE-25) 37.5-25 MG tablet, Take 1 tablet by mouth daily., Disp: , Rfl:    TRULANCE 3 MG TABS, Take 3 mg by mouth daily., Disp: , Rfl:    UNABLE TO FIND, Med Name: glucosamine sulfate sodium/chondroitin sulfate/turmeric/msm 1500 mg/150mg /150 mg/25 mg BID, Disp: , Rfl:    Vitamin D, Ergocalciferol, (DRISDOL) 1.25 MG (50000 UNIT) CAPS capsule, Take 50,000 Units by mouth every Saturday., Disp: , Rfl:     Subjective:   PATIENT ID: Darlene Rollins GENDER: female DOB: [birth year], MRN: [id number]  Chief Complaint  Patient presents with   Follow-up    Cough, shortness of breath and wheezing. Using Symbicort daily, some days twice a day.     HPI  62 year old female past medical history of mild persistent asthma presenting for follow up.  Patient was last seen by me in April of 2024. She was reporting a cough that was persistent. Following our visit, we initiated Symbicort which the patient has been compliant with. She's used it once daily, and as needed throughout the day. She feels that her cough is well controlled with the Symbicort. She has no shortness of breath or wheeze. The cough is non-productive. She recently had COVID but recovered well, had to use Symbicort twice daily and as needed during her illness.   Patient was previously seen in our clinic by Dr. Darrol Angel in 2019 for the evaluation of asthma.  Patient was previously on Advair, taking it twice daily, but stopped it after her asthma was well-controlled and her sugars were out of control.  Patient's reporting that her asthma was very well-controlled up until she contracted a URTI around Christmas of 2023. She required multiple rounds of prednisone without sustained improvement.  The symptoms improved briefly but then they recurred.  She mostly complains of a cough that resolves with the use of her Ventolin.  She is using her Ventolin 2-3 times a day.  She has a history of reflux disease previously seen by GI for which she is maintained on PPI and dietary modification.  She was also previously seen by ENT at [facility] and 2019 where her vocal cords were evaluated and noted to be somewhat edematous, thought to be due to reflux disease.  Patient reports having a dog but this does not worsen her symptoms.  She feels that her symptoms get worse after she eats as well as at night.   Patient is a lifelong non-smoker and denies any  exposures.  Ancillary information including prior medications, full medical/surgical/family/social histories, and PFTs (when available) are listed below and have been reviewed.   Review of Systems  Constitutional:  Negative for chills and fever.  Respiratory:  Negative for cough, shortness of breath and wheezing.   Cardiovascular:  Negative for chest pain.     Objective:   Vitals:   07/25/23 0819  BP: 98/72  Pulse: 92  Temp: 97.8 F (36.6 C)  TempSrc: Temporal  SpO2: 98%  Weight: 171 lb 12.8 oz (77.9 kg)  Height: 5' 2.5" (1.588 m)   98% on RA BMI Readings from Last 3 Encounters:  07/25/23 30.92 kg/m  01/18/23 34.88 kg/m  12/05/22 34.20 kg/m   Wt Readings from Last 3 Encounters:  07/25/23 171 lb 12.8 oz (77.9 kg)  01/18/23 193 lb 12.8 oz (87.9 kg)  12/05/22 190 lb (86.2 kg)    Physical Exam Constitutional:  General: She is not in acute distress.    Appearance: She is not ill-appearing.  HENT:     Mouth/Throat:     Mouth: Mucous membranes are moist.  Cardiovascular:     Rate and Rhythm: Normal rate and regular rhythm.     Pulses: Normal pulses.     Heart sounds: Normal heart sounds.  Pulmonary:     Effort: Pulmonary effort is normal.     Breath sounds: Normal breath sounds. No wheezing or rales.  Abdominal:     Palpations: Abdomen is soft.  Neurological:     General: No focal deficit present.     Mental Status: She is alert and oriented to person, place, and time. Mental status is at baseline.       Ancillary Information    Past Medical History:  Diagnosis Date   Allergy    Anemia    Arthritis    Asthma    Diabetes mellitus without complication (HCC)    GERD (gastroesophageal reflux disease)    Heart murmur      Family History  Problem Relation Age of Onset   Cancer Mother    Cancer Maternal Grandmother    Cancer Maternal Grandfather    Cancer Paternal Grandmother    Breast cancer Neg Hx      Past Surgical History:  Procedure  Laterality Date   BICEPT TENODESIS Right 12/22/2021   Procedure: BICEPS TENODESIS;  Surgeon: Christena Flake, MD;  Location: [facility] ORS;  Service: Orthopedics;  Laterality: Right;   CHOLECYSTECTOMY     NASAL SINUS SURGERY  1999   NECK SURGERY  03/2006   NECK SURGERY  2016   fusion T3-T2   SHOULDER ARTHROSCOPY Right 12/22/2021   Procedure: Shoulder arthroscopy with debridement, decompression,  rotator cuff repair,  biceps tenodesis.-RNFA;  Surgeon: Christena Flake, MD;  Location: [facility] ORS;  Service: Orthopedics;  Laterality: Right;   uterine cauterine cauterization     2009 due to heavy menstrual bleeding    Social History   Socioeconomic History   Marital status: Single    Spouse name: Not on file   Number of children: Not on file   Years of education: Not on file   Highest education level: Not on file  Occupational History   Not on file  Tobacco Use   Smoking status: Never    Passive exposure: Never   Smokeless tobacco: Never  Vaping Use   Vaping status: Never Used  Substance and Sexual Activity   Alcohol use: Not Currently   Drug use: Never   Sexual activity: Not Currently  Other Topics Concern   Not on file  Social History Narrative   Not on file   Social Determinants of Health   Financial Resource Strain: High Risk (06/30/2023)   Received from [facility]   Overall Financial Resource Strain (CARDIA)    Difficulty of Paying Living Expenses: Hard  Food Insecurity: Food Insecurity Present (06/30/2023)   Received from [facility]   Hunger Vital Sign    Worried About Running Out of Food in the Last Year: Sometimes true    Ran Out of Food in the Last Year: Never true  Transportation Needs: No Transportation Needs (06/30/2023)   Received from [facility] - Transportation    In the past 12 months, has lack of transportation kept you from medical appointments or from getting medications?: No    Lack of  Transportation (Non-Medical): No  Physical Activity: Not on file  Stress: Not on file  Social Connections: Unknown (02/22/2022)   Received from [facility]   Social Network    Social Network: Not on file  Intimate Partner Violence: Unknown (01/14/2022)   Received from [facility], [facility]   HITS    Physically Hurt: Not on file    Insult or Talk Down To: Not on file    Threaten Physical Harm: Not on file    Scream or Curse: Not on file     No Known Allergies   CBC    Component Value Date/Time   WBC 10.3 08/06/2008 1420   RBC 4.49 08/06/2008 1420   HGB 12.5 08/06/2008 1420   HCT 38.2 08/06/2008 1420   PLT 505 (H) 08/06/2008 1420   MCV 85.0 08/06/2008 1420   MCHC 32.8 08/06/2008 1420   RDW 14.5 08/06/2008 1420    Pulmonary Functions Testing Results:     No data to display          Outpatient Medications Prior to Visit  Medication Sig Dispense Refill   acetaminophen (TYLENOL) 500 MG tablet Take 1,000 mg by mouth every 8 (eight) hours as needed for moderate pain.     albuterol (VENTOLIN HFA) 108 (90 Base) MCG/ACT inhaler Inhale 2 puffs into the lungs every 4 (four) hours as needed for wheezing or shortness of breath.     budesonide-formoterol (SYMBICORT) 80-4.5 MCG/ACT inhaler Inhale 2 puffs into the lungs every 4 (four) hours as needed. 1 each 12   buPROPion (WELLBUTRIN XL) 300 MG 24 hr tablet Take 300 mg by mouth daily.     calcium carbonate (TUMS EX) 750 MG chewable tablet Chew 1 tablet by mouth daily as needed for heartburn.     cyanocobalamin (,VITAMIN B-12,) 1000 MCG/ML injection Inject 1,000 mcg into the muscle every 30 (thirty) days.     FARXIGA 10 MG TABS tablet Take 10 mg by mouth daily.     ferrous sulfate 325 (65 FE) MG tablet Take 325 mg by mouth daily with breakfast.     hyoscyamine (LEVSIN SL) 0.125 MG SL tablet Place 0.125 mg under the tongue every 4 (four) hours as needed for cramping.     loratadine (CLARITIN) 10 MG tablet Take 10  mg by mouth daily as needed for allergies.     Menthol, Topical Analgesic, (BIOFREEZE) 4 % GEL Apply 1 application topically daily as needed (pain).     metFORMIN (GLUCOPHAGE) 1000 MG tablet Take 1,000 mg by mouth 2 (two) times daily with a meal.     montelukast (SINGULAIR) 10 MG tablet Take 10 mg by mouth at bedtime.     MOUNJARO 10 MG/0.5ML Pen Inject 10 mg into the skin once a week.     Naproxen Sod-diphenhydrAMINE (ALEVE PM) 220-25 MG TABS Take 2 tablets by mouth at bedtime as needed (pain/sleep).     Probiotic Product (DIGESTIVE ADV MULTI-STRAIN ULT) CHEW Chew 1 capsule by mouth daily.     RABEprazole (ACIPHEX) 20 MG tablet Take 20 mg by mouth 2 (two) times daily.     sitaGLIPtin (JANUVIA) 100 MG tablet Take 100 mg by mouth daily.     tamsulosin (FLOMAX) 0.4 MG CAPS capsule Take 1 capsule (0.4 mg total) by mouth daily. 90 capsule 3   triamterene-hydrochlorothiazide (MAXZIDE-25) 37.5-25 MG tablet Take 1 tablet by mouth daily.     TRULANCE 3 MG TABS Take 3 mg by mouth daily.     UNABLE TO FIND  Med Name: glucosamine sulfate sodium/chondroitin sulfate/turmeric/msm 1500 mg/150mg /150 mg/25 mg BID     Vitamin D, Ergocalciferol, (DRISDOL) 1.25 MG (50000 UNIT) CAPS capsule Take 50,000 Units by mouth every Saturday.     Multiple Vitamin (MULTIVITAMIN) capsule Take 1 capsule by mouth daily.     RELION PEN NEEDLES 31G X 6 MM MISC See admin instructions.     No facility-administered medications prior to visit.

## 2023-08-03 ENCOUNTER — Other Ambulatory Visit: Payer: Self-pay | Admitting: Medical Genetics

## 2023-08-03 DIAGNOSIS — Z006 Encounter for examination for normal comparison and control in clinical research program: Secondary | ICD-10-CM

## 2023-08-16 ENCOUNTER — Other Ambulatory Visit
Admission: RE | Admit: 2023-08-16 | Discharge: 2023-08-16 | Disposition: A | Discharge status: Home / Self Care | Payer: BC Managed Care – PPO | Source: Ambulatory Visit | Attending: Medical Genetics | Admitting: Medical Genetics

## 2023-08-16 DIAGNOSIS — Z006 Encounter for examination for normal comparison and control in clinical research program: Secondary | ICD-10-CM | POA: Insufficient documentation

## 2023-08-18 IMAGING — MG MM DIGITAL SCREENING BILAT W/ TOMO AND CAD
6 of 10 series · 6 of 30 positions shown · non-contrast
Comparison: Previous exam(s).

ACR Breast Density Category a: The breast tissue is almost entirely
fatty.

CLINICAL DATA: Screening.

EXAM:
DIGITAL SCREENING BILATERAL MAMMOGRAM WITH TOMOSYNTHESIS AND CAD
TECHNIQUE: Bilateral screening digital craniocaudal and mediolateral oblique
mammograms were obtained. Bilateral screening digital breast
tomosynthesis was performed. The images were evaluated with
computer-aided detection.

[L MLO synth-2D (1 of 2)]
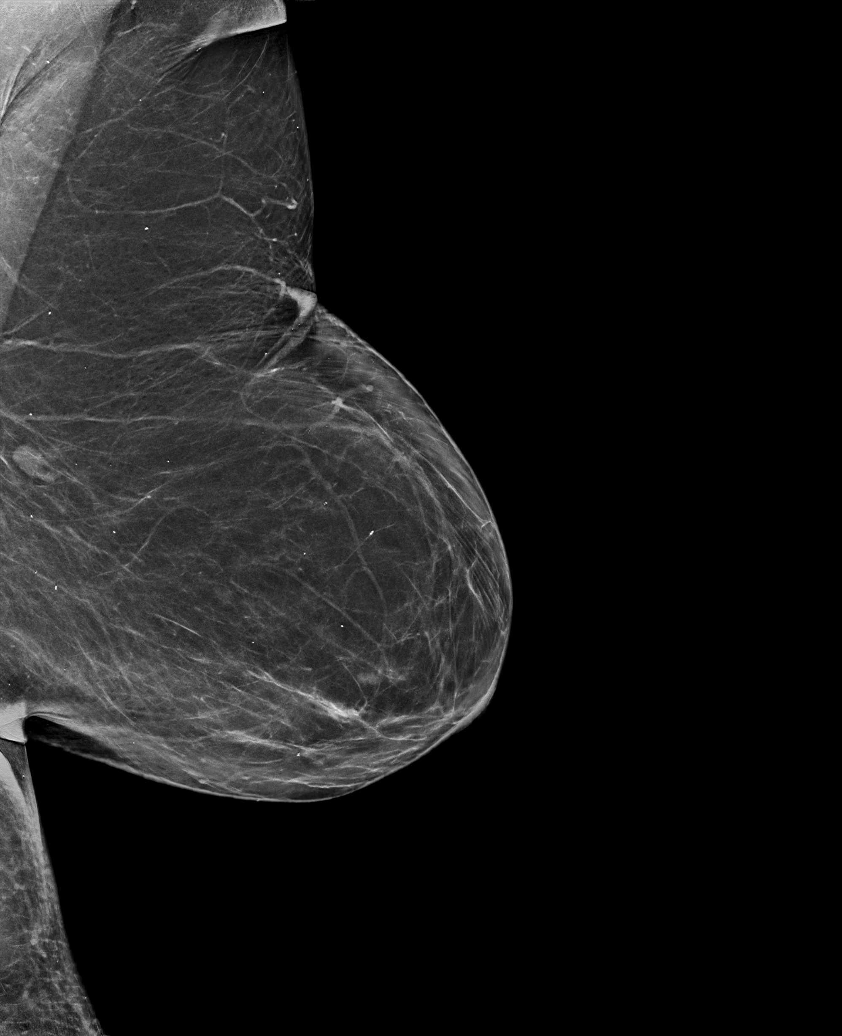

[L MLO synth-2D (2 of 2)]
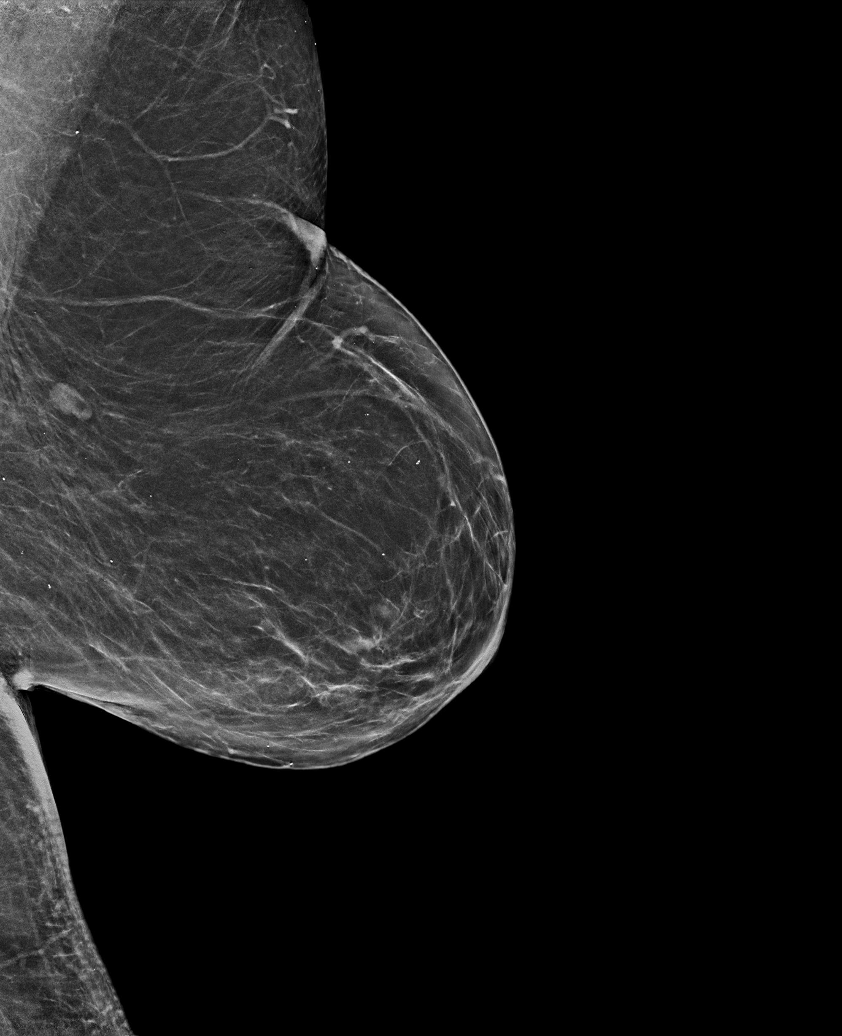

[R MLO synth-2D]
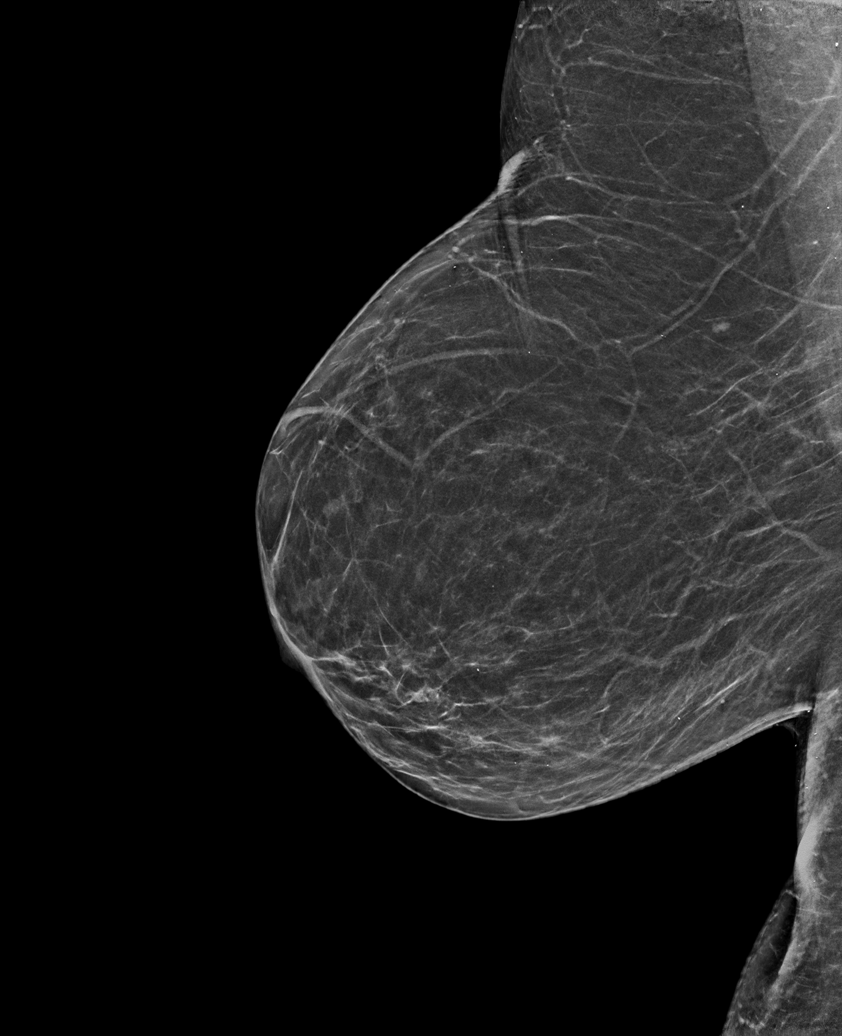

[R CC synth-2D]
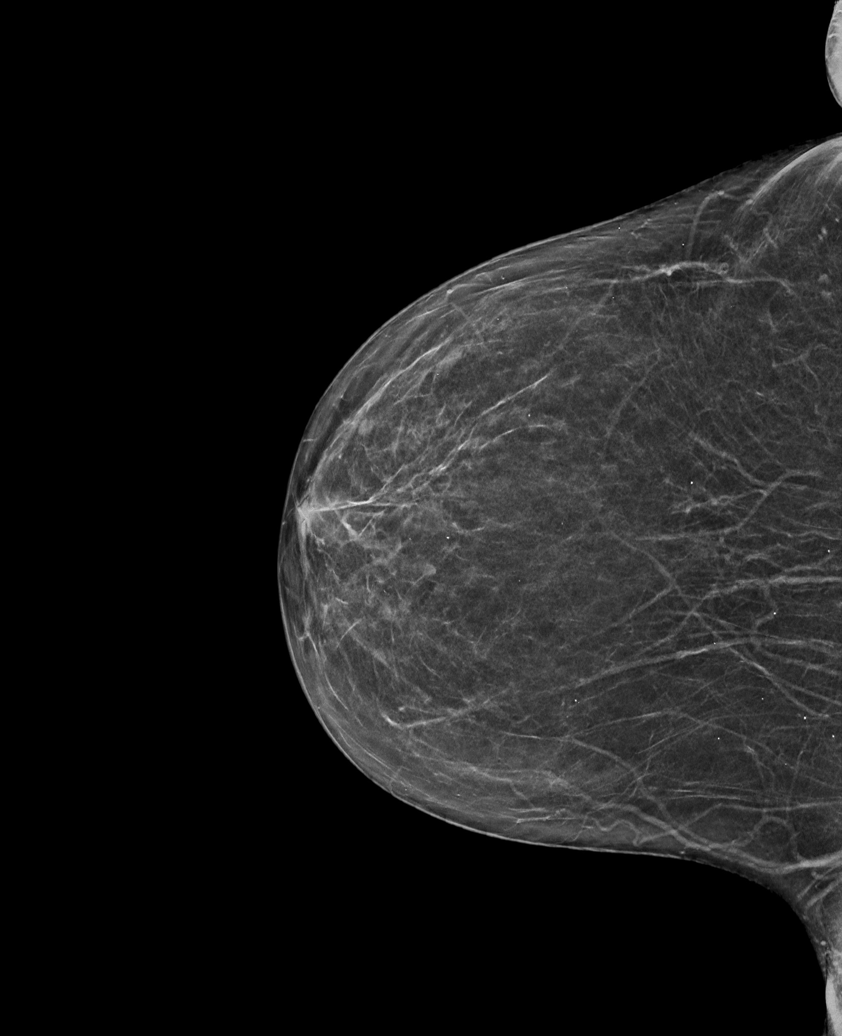

[L CC synth-2D]
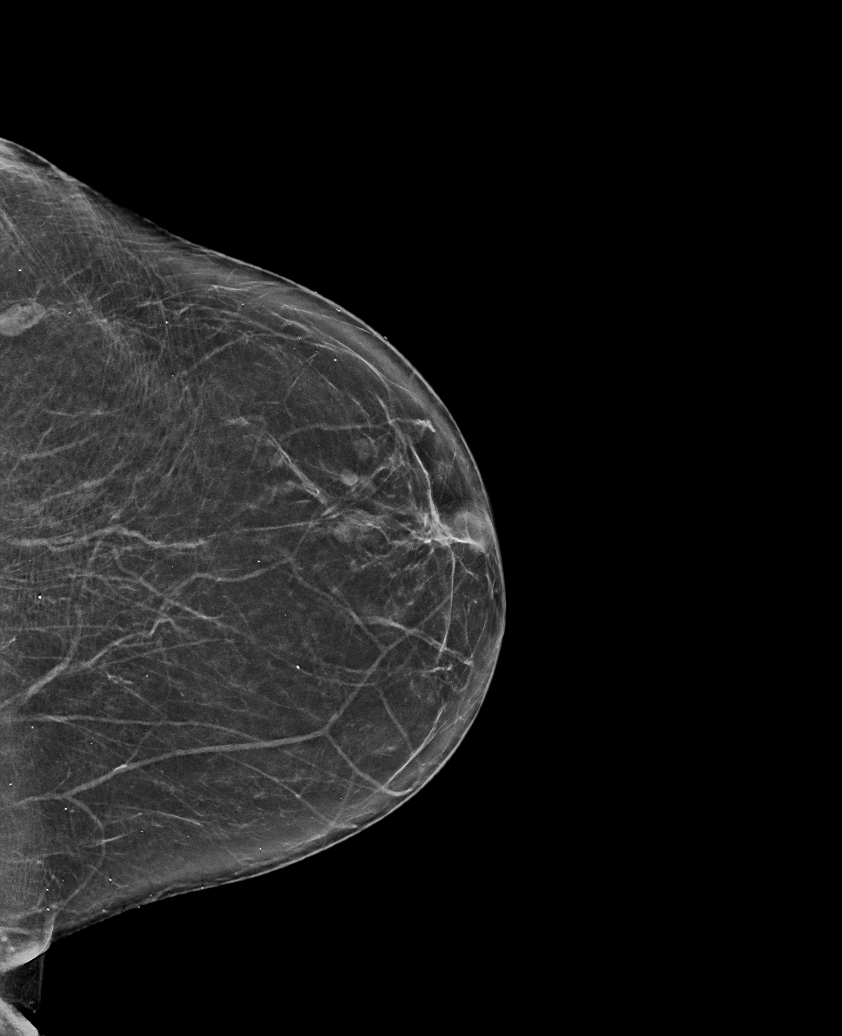

[L MLO tomo · tomo slice 38/75.0]
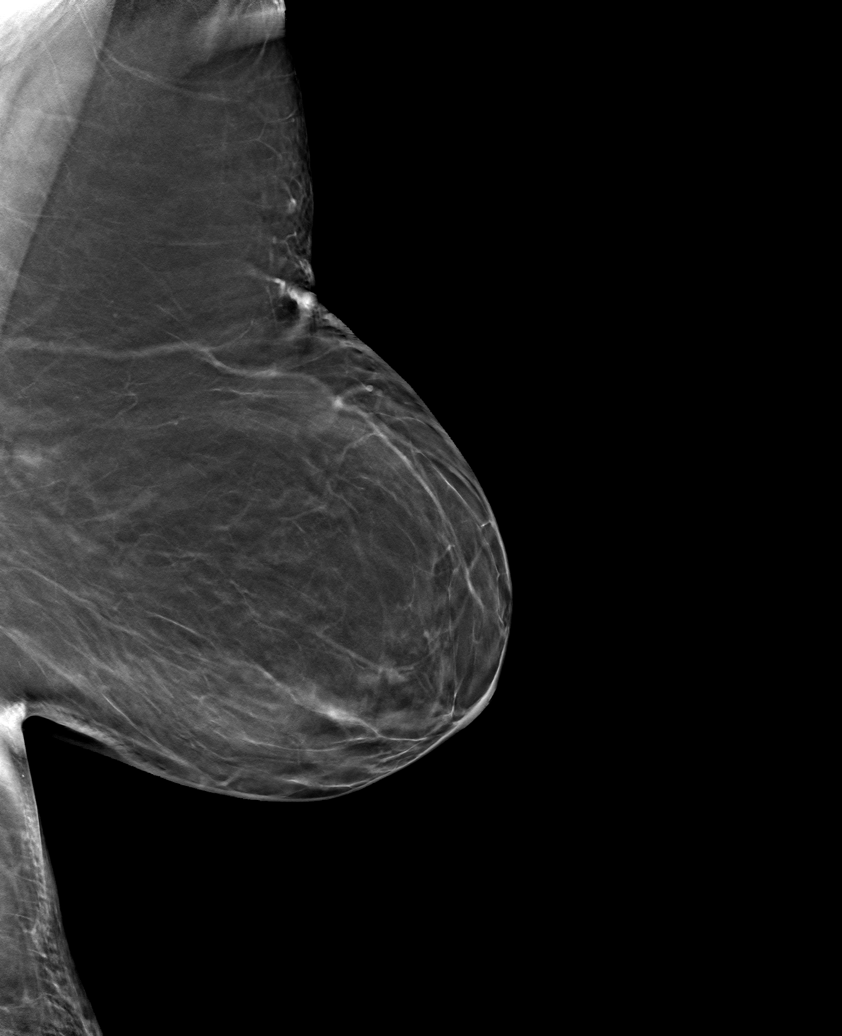

[6 of 30 positions shown; findings below may reference images not displayed]

FINDINGS: There are no findings suspicious for malignancy.
IMPRESSION: No mammographic evidence of malignancy. A result letter of this
screening mammogram will be mailed directly to the patient.

RECOMMENDATION:
Screening mammogram in one year. (Code:0E-3-N98)

BI-RADS CATEGORY  1: Negative.

## 2023-08-29 LAB — HELIX MOLECULAR SCREEN: Genetic Analysis Overall Interpretation: NEGATIVE

## 2023-08-29 LAB — GENECONNECT MOLECULAR SCREEN

## 2023-09-12 ENCOUNTER — Other Ambulatory Visit: Payer: Self-pay | Admitting: Family Medicine

## 2023-09-12 DIAGNOSIS — Z1231 Encounter for screening mammogram for malignant neoplasm of breast: Secondary | ICD-10-CM

## 2023-10-16 ENCOUNTER — Ambulatory Visit
Admission: RE | Admit: 2023-10-16 | Discharge: 2023-10-16 | Disposition: A | Discharge status: Home / Self Care | Payer: BC Managed Care – PPO | Source: Ambulatory Visit | Attending: Family Medicine | Admitting: Family Medicine

## 2023-10-16 DIAGNOSIS — Z1231 Encounter for screening mammogram for malignant neoplasm of breast: Secondary | ICD-10-CM | POA: Insufficient documentation

## 2023-12-04 ENCOUNTER — Ambulatory Visit: Payer: BC Managed Care – PPO | Admitting: Urology

## 2023-12-04 ENCOUNTER — Encounter: Payer: Self-pay | Admitting: Urology

## 2023-12-04 VITALS — BP 113/77 | HR 97 | Ht 62.0 in | Wt 178.2 lb

## 2023-12-04 DIAGNOSIS — R3912 Poor urinary stream: Secondary | ICD-10-CM | POA: Diagnosis not present

## 2023-12-04 LAB — URINALYSIS, COMPLETE
Bilirubin, UA: NEGATIVE
Glucose, UA: NEGATIVE
Ketones, UA: NEGATIVE
Nitrite, UA: NEGATIVE
Protein,UA: NEGATIVE
RBC, UA: NEGATIVE
Specific Gravity, UA: 1.025 (ref 1.005–1.030)
Urobilinogen, Ur: 0.2 mg/dL (ref 0.2–1.0)
pH, UA: 7 (ref 5.0–7.5)

## 2023-12-04 LAB — MICROSCOPIC EXAMINATION: Epithelial Cells (non renal): >10 /HPF — AB (ref 0–10)

## 2023-12-04 MED ORDER — TAMSULOSIN HCL 0.4 MG PO CAPS: 0.4000 mg | ORAL_CAPSULE | Freq: Two times a day (BID) | ORAL | 11 refills | Status: DC

## 2023-12-04 NOTE — Progress Notes (Signed)
 12/04/2023 10:24 AM   Darlene Rollins [birth year] [id number]  Referring provider: Jerl Mina, MD [address] [facility] [place],  [place] [address]  Chief Complaint  Patient presents with   Follow-up    HPI: Reviewed note.  1 year ago patient dramatically better on Flomax and physical therapy getting up once or twice a night.  90 x 3 sent to pharmacy   \Today Flow much better on physical therapy and Flomax.  Since using CPAP gets up 0-1 time a night and is very happy.  No infections    Today Flow is a little bit slower a little bit more urgency and postvoid dribbling.  Does not work as well as it used to but doing okay.  She did have a lot of long-term symptoms from COVID.  She gets brain fog.  Clinically not infected today   PMH: Past Medical History:  Diagnosis Date   Allergy    Anemia    Arthritis    Asthma    Diabetes mellitus without complication (HCC)    GERD (gastroesophageal reflux disease)    Heart murmur     Surgical History: Past Surgical History:  Procedure Laterality Date   BICEPT TENODESIS Right 12/22/2021   Procedure: BICEPS TENODESIS;  Surgeon: Christena Flake, MD;  Location: [facility] ORS;  Service: Orthopedics;  Laterality: Right;   CHOLECYSTECTOMY     NASAL SINUS SURGERY  1999   NECK SURGERY  03/2006   NECK SURGERY  2016   fusion T3-T2   SHOULDER ARTHROSCOPY Right 12/22/2021   Procedure: Shoulder arthroscopy with debridement, decompression,  rotator cuff repair,  biceps tenodesis.-RNFA;  Surgeon: Christena Flake, MD;  Location: [facility] ORS;  Service: Orthopedics;  Laterality: Right;   uterine cauterine cauterization     2009 due to heavy menstrual bleeding    Home Medications:  Allergies as of 12/04/2023   No Known Allergies      Medication List        Accurate as of December 04, 2023 10:24 AM. If you have any questions, ask your nurse or doctor.          STOP taking these medications    Farxiga 10 MG Tabs tablet Generic drug:  dapagliflozin propanediol Stopped by: Lorin Picket A Isaiah Cianci   Mounjaro 10 MG/0.5ML Pen Generic drug: tirzepatide Stopped by: Lorin Picket A Draya Felker       TAKE these medications    acetaminophen 500 MG tablet Commonly known as: TYLENOL Take 1,000 mg by mouth every 8 (eight) hours as needed for moderate pain.   albuterol 108 (90 Base) MCG/ACT inhaler Commonly known as: VENTOLIN HFA Inhale 2 puffs into the lungs every 4 (four) hours as needed for wheezing or shortness of breath.   Aleve PM 220-25 MG Tabs Generic drug: Naproxen Sod-diphenhydrAMINE Take 2 tablets by mouth at bedtime as needed (pain/sleep).   Biofreeze 4 % Gel Generic drug: Menthol (Topical Analgesic) Apply 1 application topically daily as needed (pain).   budesonide-formoterol 80-4.5 MCG/ACT inhaler Commonly known as: Symbicort Inhale 2 puffs into the lungs every 4 (four) hours as needed.   buPROPion 300 MG 24 hr tablet Commonly known as: WELLBUTRIN XL Take 300 mg by mouth daily.   calcium carbonate 750 MG chewable tablet Commonly known as: TUMS EX Chew 1 tablet by mouth daily as needed for heartburn.   cyanocobalamin 1000 MCG/ML injection Commonly known as: VITAMIN B12 Inject 1,000 mcg into the muscle every 30 (thirty) days.   Digestive Adv  Multi-Strain Ult Chew Chew 1 capsule by mouth daily.   ferrous sulfate 325 (65 FE) MG tablet Take 325 mg by mouth daily with breakfast.   hyoscyamine 0.125 MG SL tablet Commonly known as: LEVSIN SL Place 0.125 mg under the tongue every 4 (four) hours as needed for cramping.   loratadine 10 MG tablet Commonly known as: CLARITIN Take 10 mg by mouth daily as needed for allergies.   metFORMIN 1000 MG tablet Commonly known as: GLUCOPHAGE Take 1,000 mg by mouth 2 (two) times daily with a meal.   montelukast 10 MG tablet Commonly known as: SINGULAIR Take 10 mg by mouth at bedtime.   RABEprazole 20 MG tablet Commonly known as: ACIPHEX Take 20 mg by mouth 2 (two)  times daily.   sitaGLIPtin 100 MG tablet Commonly known as: JANUVIA Take 100 mg by mouth daily.   tamsulosin 0.4 MG Caps capsule Commonly known as: FLOMAX Take 1 capsule (0.4 mg total) by mouth daily.   triamterene-hydrochlorothiazide 37.5-25 MG tablet Commonly known as: MAXZIDE-25 Take 1 tablet by mouth daily.   Trulance 3 MG Tabs Generic drug: Plecanatide Take 3 mg by mouth daily.   UNABLE TO FIND Med Name: glucosamine sulfate sodium/chondroitin sulfate/turmeric/msm 1500 mg/150mg /150 mg/25 mg BID   Vitamin D (Ergocalciferol) 1.25 MG (50000 UNIT) Caps capsule Commonly known as: DRISDOL Take 50,000 Units by mouth every Saturday.        Allergies: No Known Allergies  Family History: Family History  Problem Relation Age of Onset   Cancer Mother    Cancer Maternal Grandmother    Cancer Maternal Grandfather    Cancer Paternal Grandmother    Breast cancer Neg Hx     Social History:  reports that she has never smoked. She has never been exposed to tobacco smoke. She has never used smokeless tobacco. She reports that she does not currently use alcohol. She reports that she does not use drugs.  ROS:                                        Physical Exam: LMP 07/27/2008 (Approximate) Comment: LMP Oct 2009  Constitutional:  Alert and oriented, No acute distress. HEENT: [place] AT, moist mucus membranes.  Trachea midline, no masses.  Laboratory Data: Lab Results  Component Value Date   WBC 10.3 08/06/2008   HGB 12.5 08/06/2008   HCT 38.2 08/06/2008   MCV 85.0 08/06/2008   PLT 505 (H) 08/06/2008    Lab Results  Component Value Date   CREATININE 0.85 08/06/2008    No results found for: "PSA"  No results found for: "TESTOSTERONE"  No results found for: "HGBA1C"  Urinalysis    Component Value Date/Time   APPEARANCEUR Hazy (A) 11/29/2021 1310   GLUCOSEU Negative 11/29/2021 1310   BILIRUBINUR Negative 11/29/2021 1310   PROTEINUR Negative  11/29/2021 1310   NITRITE Negative 11/29/2021 1310   LEUKOCYTESUR 2+ (A) 11/29/2021 1310    Pertinent Imaging:   Assessment & Plan: I had a side increase the Flomax to 2 tablets a day.  60 tablets with 11 refills sent.  If it does not work any better she will go back to once a day.  I will see her in a year  - Urinalysis, Complete   No follow-ups on file.  Martina Sinner, MD  [facility] [address], [address] [place], [place] [address] [phone]

## 2023-12-07 ENCOUNTER — Encounter: Payer: Self-pay | Admitting: Urology

## 2023-12-08 NOTE — Telephone Encounter (Signed)
 Pt informed, voiced understanding

## 2024-01-23 ENCOUNTER — Other Ambulatory Visit: Payer: Self-pay | Admitting: Student in an Organized Health Care Education/Training Program

## 2024-01-23 ENCOUNTER — Other Ambulatory Visit: Payer: Self-pay

## 2024-01-23 DIAGNOSIS — J4530 Mild persistent asthma, uncomplicated: Secondary | ICD-10-CM

## 2024-01-23 NOTE — Telephone Encounter (Signed)
 Copied from CRM [id number]. Topic: Clinical - Medication Refill >> Jan 23, 2024  9:40 AM Roseanne Cones wrote: Most Recent Primary Care Visit:   Medication: budesonide-formoterol (SYMBICORT) 80-4.5 MCG/ACT inhaler  Has the patient contacted their pharmacy? Yes, no refills, asked to reach out to us . (Agent: If no, request that the patient contact the pharmacy for the refill. If patient does not wish to contact the pharmacy document the reason why and proceed with request.) (Agent: If yes, when and what did the pharmacy advise?)  Is this the correct pharmacy for this prescription?  If no, delete pharmacy and type the correct one.   [id number] [place], [place] - [address] [place] [address] Phone: [phone] Fax: [phone]   Has the prescription been filled recently? No  Is the patient out of the medication? No, she has a few days, however, pollen is high and she's had to use it more.  Has the patient been seen for an appointment in the last year OR does the patient have an upcoming appointment? Yes - Appointment made in May.  Can we respond through MyChart? Yes  Agent: Please be advised that Rx refills may take up to 3 business days. We ask that you follow-up with your pharmacy.

## 2024-01-27 ENCOUNTER — Encounter: Payer: Self-pay | Admitting: Urology

## 2024-01-29 ENCOUNTER — Other Ambulatory Visit: Payer: Self-pay

## 2024-01-29 DIAGNOSIS — R3912 Poor urinary stream: Secondary | ICD-10-CM

## 2024-01-31 ENCOUNTER — Other Ambulatory Visit: Payer: Self-pay

## 2024-01-31 DIAGNOSIS — R3912 Poor urinary stream: Secondary | ICD-10-CM

## 2024-02-01 MED ORDER — TAMSULOSIN HCL 0.4 MG PO CAPS: 0.4000 mg | ORAL_CAPSULE | Freq: Two times a day (BID) | ORAL | 3 refills | Status: AC

## 2024-02-29 ENCOUNTER — Encounter: Payer: Self-pay | Admitting: Student in an Organized Health Care Education/Training Program

## 2024-02-29 ENCOUNTER — Ambulatory Visit: Admitting: Student in an Organized Health Care Education/Training Program

## 2024-02-29 ENCOUNTER — Ambulatory Visit (INDEPENDENT_AMBULATORY_CARE_PROVIDER_SITE_OTHER): Admitting: Student in an Organized Health Care Education/Training Program

## 2024-02-29 VITALS — BP 118/80 | HR 92 | Temp 98.6°F | Ht 62.5 in | Wt 177.6 lb

## 2024-02-29 DIAGNOSIS — J4530 Mild persistent asthma, uncomplicated: Secondary | ICD-10-CM

## 2024-02-29 LAB — PULMONARY FUNCTION TEST
DL/VA % pred: 117 %
DL/VA: 5.02 ml/min/mmHg/L
DLCO unc % pred: 105 %
DLCO unc: 19.61 ml/min/mmHg
FEF 25-75 Post: 3.92 L/s
FEF 25-75 Pre: 2.88 L/s
FEF2575-%Change-Post: 35 %
FEF2575-%Pred-Post: 182 %
FEF2575-%Pred-Pre: 134 %
FEV1-%Change-Post: 18 %
FEV1-%Pred-Post: 122 %
FEV1-%Pred-Pre: 103 %
FEV1-Post: 2.84 L
FEV1-Pre: 2.39 L
FEV1FVC-%Change-Post: 1 %
FEV1FVC-%Pred-Pre: 108 %
FEV6-%Change-Post: 16 %
FEV6-%Pred-Post: 114 %
FEV6-%Pred-Pre: 97 %
FEV6-Post: 3.32 L
FEV6-Pre: 2.84 L
FEV6FVC-%Change-Post: 0 %
FEV6FVC-%Pred-Post: 103 %
FEV6FVC-%Pred-Pre: 103 %
FVC-%Change-Post: 17 %
FVC-%Pred-Post: 110 %
FVC-%Pred-Pre: 94 %
FVC-Post: 3.33 L
FVC-Pre: 2.84 L
Post FEV1/FVC ratio: 85 %
Post FEV6/FVC ratio: 100 %
Pre FEV1/FVC ratio: 84 %
Pre FEV6/FVC Ratio: 100 %
RV % pred: 112 %
RV: 2.17 L
TLC % pred: 97 %
TLC: 4.64 L

## 2024-02-29 NOTE — Progress Notes (Signed)
 Full PFT completed today. Pt did seem to be tired today and said she hasn't been able to sleep.

## 2024-02-29 NOTE — Progress Notes (Signed)
 Assessment & Plan:   1. Mild persistent asthma without complication (Primary)  Patient is presenting for follow up of mild persistent asthma, cough variant. She is currently compliant with Symbicort  that she's used as a controller and rescue inhaler per GINA recommendations. I have instructed her to use the Symbicort  twice daily for now given predilection for evening cough. Once symptoms are fully resolved we can decrease the dose to one puff bid. PFT's today are fully normal, though there is a response to bronchodilator challenge with 17% improvement in FEV1 with albuterol .  -Continue Symbicort  two puffs bid  Return in about 1 year (around 02/28/2025).  I spent 30 minutes caring for this patient today, including preparing to see the patient, obtaining a medical history , reviewing a separately obtained history, performing a medically appropriate examination and/or evaluation, counseling and educating the patient/family/caregiver, ordering medications, tests, or procedures, documenting clinical information in the electronic health record, and independently interpreting results (not separately reported/billed) and communicating results to the patient/family/caregiver  Vergia Glasgow, MD [facility]   End of visit medications:  No orders of the defined types were placed in this encounter.    Current Outpatient Medications:    acetaminophen (TYLENOL) 500 MG tablet, Take 1,000 mg by mouth every 8 (eight) hours as needed for moderate pain., Disp: , Rfl:    albuterol  (VENTOLIN  HFA) 108 (90 Base) MCG/ACT inhaler, Inhale 2 puffs into the lungs every 4 (four) hours as needed for wheezing or shortness of breath., Disp: , Rfl:    Bacillus Coagulans-Guar Gum (BENEFIBER ADVANCED) PACK, Take by mouth., Disp: , Rfl:    buPROPion (WELLBUTRIN XL) 300 MG 24 hr tablet, Take 300 mg by mouth daily., Disp: , Rfl:    calcium carbonate (TUMS EX) 750 MG chewable tablet, Chew 1 tablet by  mouth daily as needed for heartburn., Disp: , Rfl:    CHONDROITIN SULFATE A PO, Take 150 mg by mouth., Disp: , Rfl:    cyanocobalamin (,VITAMIN B-12,) 1000 MCG/ML injection, Inject 1,000 mcg into the muscle every 30 (thirty) days., Disp: , Rfl:    ferrous sulfate 325 (65 FE) MG tablet, Take 325 mg by mouth daily with breakfast., Disp: , Rfl:    hyoscyamine (LEVSIN SL) 0.125 MG SL tablet, Place 0.125 mg under the tongue every 4 (four) hours as needed for cramping., Disp: , Rfl:    loratadine (CLARITIN) 10 MG tablet, Take 10 mg by mouth daily as needed for allergies., Disp: , Rfl:    Menthol, Topical Analgesic, (BIOFREEZE) 4 % GEL, Apply 1 application topically daily as needed (pain)., Disp: , Rfl:    metFORMIN (GLUCOPHAGE) 1000 MG tablet, Take 1,000 mg by mouth 2 (two) times daily with a meal., Disp: , Rfl:    montelukast (SINGULAIR) 10 MG tablet, Take 10 mg by mouth at bedtime., Disp: , Rfl:    MOUNJARO 12.5 MG/0.5ML Pen, Inject into the skin once a week., Disp: , Rfl:    Naproxen Sod-diphenhydrAMINE (ALEVE PM) 220-25 MG TABS, Take 2 tablets by mouth at bedtime as needed (pain/sleep)., Disp: , Rfl:    Probiotic Product (DIGESTIVE ADV MULTI-STRAIN ULT) CHEW, Chew 1 capsule by mouth daily., Disp: , Rfl:    RABEprazole (ACIPHEX) 20 MG tablet, Take 20 mg by mouth 2 (two) times daily., Disp: , Rfl:    sitaGLIPtin (JANUVIA) 100 MG tablet, Take 100 mg by mouth daily., Disp: , Rfl:    SYMBICORT  80-4.5 MCG/ACT inhaler, TAKE 2 PUFFS BY MOUTH EVERY  4 HOURS AS NEEDED, Disp: 10.2 each, Rfl: 12   tamsulosin  (FLOMAX ) 0.4 MG CAPS capsule, Take 1 capsule (0.4 mg total) by mouth 2 (two) times daily., Disp: 180 capsule, Rfl: 3   triamterene-hydrochlorothiazide (MAXZIDE-25) 37.5-25 MG tablet, Take 1 tablet by mouth daily., Disp: , Rfl:    TRULANCE 3 MG TABS, Take 3 mg by mouth daily., Disp: , Rfl:    Turmeric 02-999 MG CAPS, Take 150 mg by mouth., Disp: , Rfl:    UNABLE TO FIND, Med Name: glucosamine sulfate  sodium/chondroitin sulfate/turmeric/msm 1500 mg/150mg /150 mg/25 mg BID, Disp: , Rfl:    Vitamin D, Ergocalciferol, (DRISDOL) 1.25 MG (50000 UNIT) CAPS capsule, Take 50,000 Units by mouth every Saturday., Disp: , Rfl:    Subjective:   PATIENT ID: Darlene Rollins GENDER: female DOB: 07-07-1961, MRN: [id number]  Chief Complaint  Patient presents with   Follow-up    Review PFT  No complaints.    HPI  63 year old female past medical history of mild persistent asthma presenting for follow up.   Patient was initially seen by me in April of 2024. She was reporting a cough that was persistent. We initiated Symbicort  which the patient has been compliant with. She used it once daily, and as needed throughout the day. She feels that her cough is well controlled with the Symbicort .   Return Visit 02/29/2024:  Reports significant improvement in her symptoms with near resolution of the cough. There is no wheeze, shortness of breath, or chest tightness. She does notice the cough more in the evening. She's used the Symbicort  mostly 2 puffs once daily in the morning. She sometimes uses it twice daily. She's not had any recent exacerbations. She had her PFT's today and is here to discuss results.   Patient was previously seen in our clinic by Dr. Johnna Nakai in 2019 for the evaluation of asthma.  Patient was previously on Advair, taking it twice daily, but stopped it after her asthma was well-controlled and her sugars were out of control.  Patient's reporting that her asthma was very well-controlled up until she contracted a URTI around Christmas of 2023. She required multiple rounds of prednisone without sustained improvement.  The symptoms improved briefly but then they recurred. She was also previously seen by ENT at [facility] and 2019 where her vocal cords were evaluated and noted to be somewhat edematous, thought to be due to reflux disease.  Patient reports having a dog but this does not worsen her symptoms.  She feels  that her symptoms get worse after she eats as well as at night.   Patient is a lifelong non-smoker and denies any exposures. 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  Ancillary information including prior medications, full medical/surgical/family/social histories, and PFTs (when available) are listed below and have been reviewed.   Review of Systems  Constitutional:  Negative for chills and fever.  Respiratory:  Negative for cough, shortness of breath and wheezing.   Cardiovascular:  Negative for chest pain.     Objective:   Vitals:   02/29/24 1400  BP: 118/80  Pulse: 92  Temp: 98.6 F (37 C)  TempSrc: Oral  SpO2: 94%  Weight: 177 lb 9.6 oz (80.6 kg)  Height: 5' 2.5" (1.588 m)   94% on RA BMI Readings from Last 3 Encounters:  02/29/24 31.97 kg/m  02/29/24 32.48 kg/m  12/04/23 32.59 kg/m   Wt Readings from Last 3 Encounters:  02/29/24 177 lb 9.6 oz (80.6 kg)  02/29/24 177  lb 9.6 oz (80.6 kg)  12/04/23 178 lb 3.2 oz (80.8 kg)    Physical Exam Constitutional:      General: She is not in acute distress.    Appearance: She is not ill-appearing.  HENT:     Mouth/Throat:     Mouth: Mucous membranes are moist.  Cardiovascular:     Rate and Rhythm: Normal rate and regular rhythm.     Pulses: Normal pulses.     Heart sounds: Normal heart sounds.  Pulmonary:     Effort: Pulmonary effort is normal.     Breath sounds: Normal breath sounds. No wheezing or rales.  Abdominal:     Palpations: Abdomen is soft.  Neurological:     General: No focal deficit present.     Mental Status: She is alert and oriented to person, place, and time. Mental status is at baseline.       Ancillary Information    Past Medical History:  Diagnosis Date   Allergy    Anemia    Arthritis    Asthma    Diabetes mellitus without complication (HCC)    GERD (gastroesophageal reflux disease)    Heart murmur      Family History  Problem Relation Age of Onset   Cancer Mother    Cancer  Maternal Grandmother    Cancer Maternal Grandfather    Cancer Paternal Grandmother    Breast cancer Neg Hx      Past Surgical History:  Procedure Laterality Date   BICEPT TENODESIS Right 12/22/2021   Procedure: BICEPS TENODESIS;  Surgeon: Elner Hahn, MD;  Location: [facility] ORS;  Service: Orthopedics;  Laterality: Right;   CHOLECYSTECTOMY     NASAL SINUS SURGERY  1999   NECK SURGERY  03/2006   NECK SURGERY  2016   fusion T3-T2   SHOULDER ARTHROSCOPY Right 12/22/2021   Procedure: Shoulder arthroscopy with debridement, decompression,  rotator cuff repair,  biceps tenodesis.-RNFA;  Surgeon: Elner Hahn, MD;  Location: [facility] ORS;  Service: Orthopedics;  Laterality: Right;   uterine cauterine cauterization     2009 due to heavy menstrual bleeding    Social History   Socioeconomic History   Marital status: Single    Spouse name: Not on file   Number of children: Not on file   Years of education: Not on file   Highest education level: Not on file  Occupational History   Not on file  Tobacco Use   Smoking status: Never    Passive exposure: Never   Smokeless tobacco: Never  Vaping Use   Vaping status: Never Used  Substance and Sexual Activity   Alcohol use: Not Currently   Drug use: Never   Sexual activity: Not Currently  Other Topics Concern   Not on file  Social History Narrative   Not on file   Social Drivers of Health   Financial Resource Strain: Patient Declined (09/04/2023)   Received from [facility]   Overall Financial Resource Strain (CARDIA)    Difficulty of Paying Living Expenses: Patient declined  Food Insecurity: Patient Declined (09/04/2023)   Received from [facility]   Hunger Vital Sign    Worried About Running Out of Food in the Last Year: Patient declined    Ran Out of Food in the Last Year: Patient declined  Transportation Needs: Patient Declined (09/04/2023)   Received from [facility]   PRAPARE  - Transportation    Lack of Transportation (Non-Medical):  Patient declined    In the past 12 months, has lack of transportation kept you from medical appointments or from getting medications?: Patient declined  Physical Activity: Not on file  Stress: Not on file  Social Connections: Unknown (02/22/2022)   Received from [facility]   Social Network    Social Network: Not on file  Intimate Partner Violence: Unknown (01/14/2022)   Received from [facility]   HITS    Physically Hurt: Not on file    Insult or Talk Down To: Not on file    Threaten Physical Harm: Not on file    Scream or Curse: Not on file     No Known Allergies   CBC    Component Value Date/Time   WBC 10.3 08/06/2008 1420   RBC 4.49 08/06/2008 1420   HGB 12.5 08/06/2008 1420   HCT 38.2 08/06/2008 1420   PLT 505 (H) 08/06/2008 1420   MCV 85.0 08/06/2008 1420   MCHC 32.8 08/06/2008 1420   RDW 14.5 08/06/2008 1420    Pulmonary Functions Testing Results:    Latest Ref Rng & Units 02/29/2024   12:43 PM  PFT Results  FVC-Pre L 2.84   FVC-Predicted Pre % 94   FVC-Post L 3.33   FVC-Predicted Post % 110   Pre FEV1/FVC % % 84   Post FEV1/FCV % % 85   FEV1-Pre L 2.39   FEV1-Predicted Pre % 103   FEV1-Post L 2.84   DLCO uncorrected ml/min/mmHg 19.61   DLCO UNC% % 105   DLVA Predicted % 117   TLC L 4.64   TLC % Predicted % 97   RV % Predicted % 112     Outpatient Medications Prior to Visit  Medication Sig Dispense Refill   acetaminophen (TYLENOL) 500 MG tablet Take 1,000 mg by mouth every 8 (eight) hours as needed for moderate pain.     albuterol  (VENTOLIN  HFA) 108 (90 Base) MCG/ACT inhaler Inhale 2 puffs into the lungs every 4 (four) hours as needed for wheezing or shortness of breath.     Bacillus Coagulans-Guar Gum (BENEFIBER ADVANCED) PACK Take by mouth.     buPROPion (WELLBUTRIN XL) 300 MG 24 hr tablet Take 300 mg by mouth daily.     calcium carbonate (TUMS EX) 750 MG  chewable tablet Chew 1 tablet by mouth daily as needed for heartburn.     CHONDROITIN SULFATE A PO Take 150 mg by mouth.     cyanocobalamin (,VITAMIN B-12,) 1000 MCG/ML injection Inject 1,000 mcg into the muscle every 30 (thirty) days.     ferrous sulfate 325 (65 FE) MG tablet Take 325 mg by mouth daily with breakfast.     hyoscyamine (LEVSIN SL) 0.125 MG SL tablet Place 0.125 mg under the tongue every 4 (four) hours as needed for cramping.     loratadine (CLARITIN) 10 MG tablet Take 10 mg by mouth daily as needed for allergies.     Menthol, Topical Analgesic, (BIOFREEZE) 4 % GEL Apply 1 application topically daily as needed (pain).     metFORMIN (GLUCOPHAGE) 1000 MG tablet Take 1,000 mg by mouth 2 (two) times daily with a meal.     montelukast (SINGULAIR) 10 MG tablet Take 10 mg by mouth at bedtime.     MOUNJARO 12.5 MG/0.5ML Pen Inject into the skin once a week.     Naproxen Sod-diphenhydrAMINE (ALEVE PM) 220-25 MG TABS Take 2 tablets by mouth at bedtime as needed (pain/sleep).  Probiotic Product (DIGESTIVE ADV MULTI-STRAIN ULT) CHEW Chew 1 capsule by mouth daily.     RABEprazole (ACIPHEX) 20 MG tablet Take 20 mg by mouth 2 (two) times daily.     sitaGLIPtin (JANUVIA) 100 MG tablet Take 100 mg by mouth daily.     SYMBICORT  80-4.5 MCG/ACT inhaler TAKE 2 PUFFS BY MOUTH EVERY 4 HOURS AS NEEDED 10.2 each 12   tamsulosin  (FLOMAX ) 0.4 MG CAPS capsule Take 1 capsule (0.4 mg total) by mouth 2 (two) times daily. 180 capsule 3   triamterene-hydrochlorothiazide (MAXZIDE-25) 37.5-25 MG tablet Take 1 tablet by mouth daily.     TRULANCE 3 MG TABS Take 3 mg by mouth daily.     Turmeric 02-999 MG CAPS Take 150 mg by mouth.     UNABLE TO FIND Med Name: glucosamine sulfate sodium/chondroitin sulfate/turmeric/msm 1500 mg/150mg /150 mg/25 mg BID     Vitamin D, Ergocalciferol, (DRISDOL) 1.25 MG (50000 UNIT) CAPS capsule Take 50,000 Units by mouth every Saturday.     No facility-administered medications prior  to visit.

## 2024-02-29 NOTE — Patient Instructions (Signed)
 Full PFT completed today ? ?

## 2024-09-27 ENCOUNTER — Other Ambulatory Visit: Payer: Self-pay | Admitting: Family Medicine

## 2024-09-27 DIAGNOSIS — Z1231 Encounter for screening mammogram for malignant neoplasm of breast: Secondary | ICD-10-CM

## 2024-10-18 ENCOUNTER — Encounter

## 2024-11-01 ENCOUNTER — Encounter

## 2024-12-02 ENCOUNTER — Ambulatory Visit: Payer: BC Managed Care – PPO | Admitting: Urology

## 2024-12-04 ENCOUNTER — Encounter

## 2024-12-09 ENCOUNTER — Ambulatory Visit: Payer: BC Managed Care – PPO | Admitting: Urology
